# Patient Record
Sex: Male | Born: 1960 | Race: White | Hispanic: No
Health system: Midwestern US, Academic
[De-identification: ages and names within clinical notes are randomized; demographics above are authoritative.]

---

## 2017-04-14 ENCOUNTER — Encounter: Admit: 2017-04-14 | Discharge: 2017-04-14 | Payer: MEDICAID

## 2017-05-24 LAB — BASIC METABOLIC PANEL
Lab: 101 (ref 98–107)
Lab: 102 (ref 70–105)
Lab: 13 (ref 10–20)
Lab: 139 (ref 136–145)
Lab: 15 — ABNORMAL HIGH (ref 0–14)
Lab: 27 (ref 22–29)
Lab: 4.4 (ref 3.5–5.1)
Lab: 46
Lab: 54
Lab: 9 (ref 8.4–10.2)

## 2017-05-25 ENCOUNTER — Encounter: Admit: 2017-05-25 | Discharge: 2017-05-25 | Payer: MEDICAID

## 2017-05-25 ENCOUNTER — Ambulatory Visit: Admit: 2017-05-25 | Discharge: 2017-05-25 | Payer: MEDICAID

## 2017-05-25 ENCOUNTER — Ambulatory Visit: Admit: 2017-05-25 | Discharge: 2017-05-26 | Payer: Medicaid Other

## 2017-05-25 ENCOUNTER — Encounter: Admit: 2017-05-25 | Discharge: 2017-05-25 | Payer: Medicaid Other

## 2017-05-25 DIAGNOSIS — E1143 Type 2 diabetes mellitus with diabetic autonomic (poly)neuropathy: ICD-10-CM

## 2017-05-25 DIAGNOSIS — I509 Heart failure, unspecified: ICD-10-CM

## 2017-05-25 DIAGNOSIS — I420 Dilated cardiomyopathy: Principal | ICD-10-CM

## 2017-05-25 DIAGNOSIS — Z794 Long term (current) use of insulin: ICD-10-CM

## 2017-05-25 DIAGNOSIS — I5022 Chronic systolic (congestive) heart failure: ICD-10-CM

## 2017-05-25 DIAGNOSIS — I429 Cardiomyopathy, unspecified: Principal | ICD-10-CM

## 2017-05-25 DIAGNOSIS — I5023 Acute on chronic systolic (congestive) heart failure: Principal | ICD-10-CM

## 2017-05-25 DIAGNOSIS — E119 Type 2 diabetes mellitus without complications: ICD-10-CM

## 2017-05-25 DIAGNOSIS — I739 Peripheral vascular disease, unspecified: ICD-10-CM

## 2017-05-25 LAB — URINALYSIS DIPSTICK REFLEX TO CULTURE
Lab: 1 (ref 1.003–1.035)
Lab: 6 (ref 5.0–8.0)
Lab: NEGATIVE

## 2017-05-25 LAB — PROTEIN/CR RATIO,UR RAN
Lab: 0.2
Lab: 192 mg/dL
Lab: 30 mg/dL — AB

## 2017-05-25 LAB — URINALYSIS MICROSCOPIC REFLEX TO CULTURE

## 2017-05-25 MED ORDER — FUROSEMIDE IV DRIP SYR (MAX CONC)
40-80 mg/h | INTRAVENOUS | 0 refills | Status: CN
Start: 2017-05-25 — End: ?

## 2017-05-25 MED ORDER — CHLOROTHIAZIDE SODIUM 500 MG IV SOLR
250-500 mg | INTRAVENOUS | 0 refills | Status: CN | PRN
Start: 2017-05-25 — End: ?

## 2017-05-25 MED ORDER — PERFLUTREN LIPID MICROSPHERES 1.1 MG/ML IV SUSP
1-20 mL | Freq: Once | INTRAVENOUS | 0 refills | Status: CP
Start: 2017-05-25 — End: ?
  Administered 2017-05-25: 16:00:00 1.5 mL via INTRAVENOUS

## 2017-05-25 MED ORDER — MAGNESIUM OXIDE 400 MG PO TAB
400 mg | Freq: Once | ORAL | 0 refills | Status: CN
Start: 2017-05-25 — End: ?

## 2017-05-25 MED ORDER — FUROSEMIDE 80 MG PO TAB
ORAL_TABLET | ORAL | 5 refills | 90.00000 days | Status: AC
Start: 2017-05-25 — End: 2017-06-04

## 2017-05-25 MED ORDER — FUROSEMIDE 10 MG/ML IJ SOLN
40 mg | INTRAVENOUS | 0 refills | Status: CN
Start: 2017-05-25 — End: ?

## 2017-05-25 MED ORDER — POTASSIUM CHLORIDE 10 MEQ PO TBTQ
10-60 meq | ORAL | 0 refills | Status: CN | PRN
Start: 2017-05-25 — End: ?

## 2017-05-25 NOTE — Progress Notes
Date of Service: 05/25/2017    Bosie Summit is a 56 y.o. male.       HPI     It was my pleasure to see Mr. Sciarrino, a 56 year old male, in the Heart Failure Clinic.  I saw him first in early September, 2016.  He had just moved from [place] to the state of [place].  Prior to his relocation, he had his primary prevention ICD for an EF of 25% in December 2015 and subsequently he had his CardioMEMS somewhere in June 2016 prior to relocation in [place].  He had history of nonischemic cardiomyopathy as coronary angiogram in 2016 was without any obstructive coronary artery disease.  All these tests were done in [place] in [place].     ???  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  His diabetes is being managed by his primary care, he reported that his hemoglobin A1c is reasonably under control.  ???  He also had CardioMEMS placed, and his pillow is not working.  We have advised him multiple times to get in touch with [employer] and [employer] to get his new pillow, however, he had issues with payments and he was not able to pay as he did not have any insurance.  ???  Mr. Rhunette Croft returns approximately 18 months later in the heart failure clinic, his care has been [facility], in May 2017 he had foot surgery and amputation of right toes, I do not have all the details.    He was also known to have prolonged QRS and he was upgraded to CRT D and [facility] in May.  He continued to have low ejection fraction with LV size of 6.8 cm on his most recent echocardiogram on August 2018.  Previously his LV size was 7.6 cm with EF of 20%    Since his of gradation of CRT-D, he is not significantly better however he did not lose weight and in fact has gained from baseline weight of 290-310 pounds today.  He reports NYHA class 2-3 symptoms reports able to walk 3 blocks  He reports that he uses 3 or 4 pillows suggestive of some orthopnea.  His pillow is not working and therefore we do not have any CardioMEMS readings on him.          Vitals:    05/25/17 1148   BP: 148/82   Pulse: 76   SpO2: 93%   Weight: (!) 139.7 kg (308 lb)   Height: 1.829 m (6')     Body mass index is 41.77 kg/m???.     Past Medical History  Patient Active Problem List    Diagnosis Date Noted   ??? Dilated cardiomyopathy (HCC) 12/04/2015   ??? Community acquired pneumonia 08/06/2015   ??? Achalasia of esophagus 06/21/2015   ??? Cardiac defibrillator in place 06/21/2015     Placed in January, 2016    [employer] DDD-ICD     ??? 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 Dyslipidemia 06/18/2015   ??? Hypertension 06/18/2015   ??? Diabetic neuropathy (HCC) 06/18/2015   ??? Syncope 06/18/2015   ??? Chronic systolic heart failure (HCC) 06/18/2015     cardiomems placement June 2016  Basline PA 55/33    ECHO 04/12/2014: LVEF 50-55%  Mildly dilated LV, normal wall  motion, grade 2 diastolic dysfunction, L atrium is mildly dilated, mild aortic regurgitation, mildly dilated aortic root     Cardiac Cath:  04/18/2013: L Heart Cath/L Ventriculography/Selective coronary angiography  -normal coronary arteries, LV is dilated and has reduced EF of 41%    03/05/2015: R Heart Cath, Pulmonary angiography, placement of cardioMEM   -CI Fick 2.1, CO Fick 5., PA Sat 60.5, RV 60/16, PA 67/34, PCW 17             Review of Systems   Constitution: Negative.   HENT: Negative.    Eyes: Negative.    Cardiovascular: Negative.    Respiratory: Negative.    Endocrine: Negative.    Hematologic/Lymphatic: Negative.    Skin: Negative.    Musculoskeletal: Negative.    Gastrointestinal: Negative.    Genitourinary: Negative.    Neurological: Negative.    Psychiatric/Behavioral: Negative.    Allergic/Immunologic: Negative. All other systems reviewed and are negative.      Physical Exam  General Appearance: no acute distress   Neck Veins: Neck veins DISTENDED CVP 12 mmHg   Extremities: Normal bilateral pedal pulses.2+ Pedal Edema .  Carotid Arteries: normal carotid upstroke bilaterally, no bruits   Cardiac Rhythm: regular rhythm and normal rate   Cardiac Auscultation: Normal S1 & S2, no S3 or S4, no rub, 2/5 HSM at mitral area    Chest Inspection: chest is normal in appearance   Auscultation/Percussion: lungs clear to auscultation, no rales, rhonchi    Skin: warm, moist, no ulcers   HENT: Moist oral mucosa.   Eyes; Pupils are round and reactive. No icterus  Abdominal Exam: soft, non-tender, no masses, bowel sounds normal no organomegaly   Neurologic Exam: neurological assessment grossly intact      Cardiovascular Studies      Problems Addressed Today  No diagnosis found.    Assessment and Plan     Heart failure with reduced ejection fraction with EF of 25-30% with LV size of 6.8 cm.  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  His LV size has reduced from 7.6 cm to 6.8 cm however continued low ejection fraction of 25-30%, aortic regurgitation is moderate.  He is quite volume overloaded therefore we are proceeding with infusion therapy to goal at least 20 pounds fluid loss and try to achieve 290 pounds.  He had all his labs drawn yesterday by his primary care, requested him to send the fax of results from yesterday to [place].  It is important to check his hemoglobin A1c if it is not done so.    We reviewed his device which did not show any evidence of VT or SVT.    He will have infusion clinic visits on Thursday and Friday and based on a response further visits will be scheduled.  He already has CardioMEMS, we will arrange to get him his pillow to measure his CardioMEMS readings to establish his dry weight.    He had his repeat angiogram as well as Peripheral arterial  disease workup at [facility] last year we will request records.    In future we will also address bariatric surgery option once his heart failure symptoms are under control.    He wanted to have a spinal injection, he can come off a apixaban if needed for 5-7 days in order to control his low back pain, he does not need bridging as risk of his spinal complications with anticoagulation exceeds the benefits of anticoagulation which is prevention of stroke.      Total time 50 minutes. Estimated counseling time 35 minutes including risks/benefits/alternatives discussion related to plan as outlined and answering all questions related to the care plan while educating on the importance of adherence to recommended therapies, outpatient follow-up, and also addressing prognosis specific to the patient/family concerns.    Renita Papa MD, MSc  [facility]   [facility]  Pager: [phone]                  Current Medications (including today's revisions)  ??? albuterol-ipratropium (DUO-NEB, DUO-VENT) 0.5 mg-3 mg(2.5 mg base)/3 mL nebulizer solution Inhale 3 mL solution by nebulizer as directed four times daily.   ??? amiodarone (CORDARONE) 200 mg tablet Take 200 mg by mouth twice daily. Take with food.   ??? apixaban (ELIQUIS) 5 mg tablet Take 5 mg by mouth twice daily.   ??? aspirin EC 81 mg tablet Take 81 mg by mouth daily. Take with food.   ??? Blood Pressure Monitor (BLOOD PRESSURE KIT) kit Use as directed ??? carvedilol (COREG) 3.125 mg tablet Take 3.125 mg by mouth twice daily with meals. Take with food.   ??? cyclobenzaprine (FLEXERIL) 10 mg tablet Take 10 mg by mouth daily as needed.   ??? HYDROcodone/acetaminophen(+) (NORCO) 10/325 mg tablet Take 1 Tab by mouth every 8 hours as needed for Pain   ??? insulin glargine (TOUJEO SOLOSTAR) 300 unit/mL (1.5 mL) injectable Inject 40 Units under the skin at bedtime daily.   ??? lisinopril (PRINIVIL, ZESTRIL) 2.5 mg tablet Take 2.5 mg by mouth daily.   ??? nitroglycerin (NITROSTAT) 0.4 mg tablet Place 0.4 mg under tongue every 5 minutes as needed for Chest Pain. Max of 3 tablets, call 911.   ??? NOVOLOG FLEXPEN U-100 INSULIN 100 unit/mL injection PEN Inject 15 Units under the skin three times daily with meals.   ??? omeprazole DR(+) (PRILOSEC) 20 mg capsule Take 20 mg by mouth daily before breakfast.   ??? potassium chloride SR (K-DUR) 10 mEq tablet Take 10 mEq by mouth daily. Take with a meal and a full glass of water.   ??? pregabalin (LYRICA) 150 mg capsule Take 150 mg by mouth three times daily.   ??? simvastatin (ZOCOR) 20 mg tablet Take 20 mg by mouth at bedtime daily.   ??? spironolactone (ALDACTONE) 50 mg tablet Take 1 Tab by mouth daily. Take with food.  Indications: CHRONIC HEART FAILURE (Patient taking differently: Take 25 mg by mouth daily. Take with food.)

## 2017-05-25 NOTE — Progress Notes
The AVRU with Marcial Pacas, [phone], confirmed benefits and eligibility:  Patient is enrolled and the plan is active.  It will pay 100% of allowable charges through [place] [employer].  No pre-certification is required for CHF Infusions using Injectafer, Iron Sucrose, Lasix or Bumex 96365 [id number] F3545 J1756 I1940 S0171.  Reference 05/25/2017 13:25

## 2017-05-25 NOTE — Progress Notes
Peripheral IV Insertion Note:  Patient Side: right  Line Orientation:Upper Arm  IV Catheter Size: 24G  Number of Attempts:1.  IV capped and flushed with Normal Saline.  IV site without redness, swelling, or pain.  New dressing placed.

## 2017-05-27 ENCOUNTER — Encounter: Admit: 2017-05-27 | Discharge: 2017-05-27 | Payer: MEDICAID

## 2017-05-27 ENCOUNTER — Ambulatory Visit: Admit: 2017-05-27 | Discharge: 2017-05-28 | Payer: MEDICAID

## 2017-05-27 DIAGNOSIS — I5023 Acute on chronic systolic (congestive) heart failure: Principal | ICD-10-CM

## 2017-05-27 DIAGNOSIS — I480 Paroxysmal atrial fibrillation: ICD-10-CM

## 2017-05-27 DIAGNOSIS — I509 Heart failure, unspecified: ICD-10-CM

## 2017-05-27 DIAGNOSIS — E119 Type 2 diabetes mellitus without complications: ICD-10-CM

## 2017-05-27 DIAGNOSIS — I739 Peripheral vascular disease, unspecified: ICD-10-CM

## 2017-05-27 DIAGNOSIS — I10 Essential (primary) hypertension: ICD-10-CM

## 2017-05-27 DIAGNOSIS — I429 Cardiomyopathy, unspecified: Principal | ICD-10-CM

## 2017-05-27 LAB — POC BASIC METABOLIC PANEL (BMP)
Lab: 1.5 mg/dL — ABNORMAL HIGH (ref 0.4–1.24)
Lab: 136 MMOL/L — ABNORMAL LOW (ref 137–147)
Lab: 191 mg/dL — ABNORMAL HIGH (ref 70–100)
Lab: 27 mg/dL — ABNORMAL HIGH (ref 7–25)
Lab: 30 MMOL/L (ref 21–30)
Lab: 4 MMOL/L (ref 3.5–5.1)
Lab: 95 MMOL/L — ABNORMAL LOW (ref 98–110)
Lab: 16 — ABNORMAL HIGH (ref 3–12)

## 2017-05-27 LAB — BNP POC ER: Lab: 430 pg/mL — ABNORMAL HIGH (ref 0–100)

## 2017-05-27 MED ORDER — POTASSIUM CHLORIDE 10 MEQ PO TBTQ
10-60 meq | ORAL | 0 refills | Status: DC | PRN
Start: 2017-05-27 — End: 2017-05-27
  Administered 2017-05-27: 16:00:00 40 meq via ORAL

## 2017-05-27 MED ORDER — MAGNESIUM OXIDE 400 MG PO TAB
400 mg | Freq: Once | ORAL | 0 refills | Status: CN
Start: 2017-05-27 — End: ?

## 2017-05-27 MED ORDER — FUROSEMIDE 10 MG/ML IJ SOLN
40 mg | INTRAVENOUS | 0 refills | Status: CP
Start: 2017-05-27 — End: ?
  Administered 2017-05-27 (×3): 40 mg via INTRAVENOUS

## 2017-05-27 MED ORDER — MAGNESIUM OXIDE 400 MG PO TAB
400 mg | Freq: Once | ORAL | 0 refills | Status: CP
Start: 2017-05-27 — End: ?
  Administered 2017-05-27: 16:00:00 400 mg via ORAL

## 2017-05-27 MED ORDER — FUROSEMIDE 10 MG/ML IJ SOLN
40 mg | INTRAVENOUS | 0 refills | Status: CN
Start: 2017-05-27 — End: ?

## 2017-05-27 MED ORDER — FUROSEMIDE IV DRIP SYR (MAX CONC)
40-80 mg/h | INTRAVENOUS | 0 refills | Status: CN
Start: 2017-05-27 — End: ?

## 2017-05-27 MED ORDER — POTASSIUM CHLORIDE 10 MEQ PO TBTQ
10-60 meq | ORAL | 0 refills | Status: CN | PRN
Start: 2017-05-27 — End: ?

## 2017-05-27 MED ORDER — FUROSEMIDE IV DRIP SYR (MAX CONC)
40-80 mg/h | INTRAVENOUS | 0 refills | Status: DC
Start: 2017-05-27 — End: 2017-05-27
  Administered 2017-05-27: 16:00:00 40 mg/h via INTRAVENOUS

## 2017-05-27 MED ORDER — CHLOROTHIAZIDE SODIUM 500 MG IV SOLR
250-500 mg | INTRAVENOUS | 0 refills | Status: CN | PRN
Start: 2017-05-27 — End: ?

## 2017-05-27 NOTE — Progress Notes
Summary for Heart Failure Infusion Clinic   Today's Date: 05/27/2017               Todays Goal: 2 L      Today you diuresed a total of 1575 ml     Total dose of IV Push: 120mg  of Lasix     400 mg of Magnesium   40 mEq of Potassium      IV Infusion dose was: 40mg /hr x 3 hrs     Pre-infusion weight was: 307 lbs     Post-Infusion weight was: 302.4 lbs     Total Net Output:-1463ml       Post Infusion Vitals for today:                Blood Pressure:99/60              Heart Rate:69              Oxygen Saturation:95%

## 2017-05-27 NOTE — Progress Notes
Cardiology Infusion Progress Note    Today's Date:  05/27/2017  Name: Larry Pena     MRN: [id number]     HPI:     Larry Pena is a 56 y.o. male.   Patient presents today for first  infusion for symptomatic treatment of decompensated heart failure symptoms. Patient has never been in the Infusion clinic before.     Pt was referred to HF Infusion Clinic by Dr B.Chales Abrahams after being seen in HF clinic on 05/25/17 and was found to have ~20 lbs excess fluid on board.    05/25/17 (Dr Chales Abrahams clinic visit) Initial weight: 308 lbs    Today:   Wt Readings from Last 1 Encounters:   05/27/17 (!) 139.3 kg (307 lb)       Today he reports productive cough with thick green sputum, peripheral edema and weight change.     He denies shortness of breath, dyspnea on exertion, paroxysmal nocturnal dyspnea, orthopnea, lightheadedness, palpitations, chest pain, abdominal fullness and muscle cramping.  He reports taking medication as instructed.       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 BNP today is 430. He admits to having difficulty following fluid restriction. Diuretic is furosemide 80 mg in AM and 40 mg in PM with Kdur 20 mEq daily. Will plan to diurese today per protocol.   2. Non-ischemic cardiomyopathy, on suboptimal neurohormonal antagonist (NHA). Currently taking carvedilol 3.125 mg bid, lisinopril 2.5 mg daily, spironolactone 25 mg daily. He recently had Echo completed on 8/21 however results are not yet available. Prior echo in 11/2015 showed EF 15-20% with grade I diastolic dysfunction.   3. 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 Creatinine today is 1.5.Will monitor renal function closely; check BMP daily before starting diuresis.   4. Coronary Artery Disease surveillance. LHC in 2016 was negative for obstructive CAD. No current symptoms of ischemia.Will continue on his Asa 81 mg daily and simvastatin 20 mg qhs.   5. Hypertension. Blood pressure: 124/65. Currently taking carvedilol, lisinopril, and spironolactone as described above.   6. CRT-D. Recently upgraded in 02/2017. Per last check on 03/03/17 showed 99.6% biventricular pacing with normal device function. Will continue to optimize device function.   7. Atrial arrhythmias. Continues chronic anticoagulation with eliquis 5 mg bid.   8. DM 2. Currently taking Toujeo 40 units subq QHS, Novolog 15 units subq tid with meals. Last HbgA1c 10.0 in 11/2015; he reports he has been getting most of his care at [facility] in [place], [place]. No recent notes in Care Everywhere regarding his diabetes.       Plans    1. Furosemide IV infusion therapy with 40 mg IV push and  40 mg/hr continuous infusion for 3 hours. Additional bolus times 2.   2. BMP, BNP  3. Goal:  2 liters net output.   4. Goal weight: ~290 lbs      5. Potassium 40 mEq  PO   6. Magnesium oxide 400mg  PO   7. Dietary counseling  8. Pharmacy counseling      Discharge Recommendations:    1. Resume home medications, including routine diuretic schedule.  2. Daily weight  monitoring.  Bring weight log to follow-up appointment.  3. 2000mg  sodium dietary restriction.  Bring dietary log to follow-up appointment.  4. Return to HF Infusion Clinic on 05/28/17     I had a long discussion with [name] today about his concerns, condition, and treatment options. All questions from [name] were answered in detail to the extent that [name] understands and agrees with the medical plan. Total visit of 50 minutes with [name] of which 45 minutes were dedicated to advanced HF counseling and care coordination.     Objective:      Visit Diagnosis:   No diagnosis found.      Medications Current Outpatient Prescriptions on File Prior to Visit   Medication Sig Dispense Refill   ??? albuterol-ipratropium (DUO-NEB, DUO-VENT) 0.5 mg-3 mg(2.5 mg base)/3 mL nebulizer solution Inhale 3 mL solution by nebulizer as directed four times daily.     ??? amiodarone (CORDARONE) 200 mg tablet Take 200 mg by mouth twice daily. Take with food.     ??? apixaban (ELIQUIS) 5 mg tablet Take 5 mg by mouth twice daily.     ??? aspirin EC 81 mg tablet Take 81 mg by mouth daily. Take with food.     ??? Blood Pressure Monitor (BLOOD PRESSURE KIT) kit Use as directed 1 Kit 0   ??? carvedilol (COREG) 3.125 mg tablet Take 3.125 mg by mouth twice daily with meals. Take with food.     ??? cyclobenzaprine (FLEXERIL) 10 mg tablet Take 10 mg by mouth daily as needed.     ??? furosemide (LASIX) 80 mg tablet Please take Lasix 80 mg in the am, 40 mg (1/2 tablet) in the afternoon 45 tablet 5   ??? HYDROcodone/acetaminophen(+) (NORCO) 10/325 mg tablet Take 1 Tab by mouth every 8 hours as needed for Pain     ??? insulin glargine (TOUJEO SOLOSTAR) 300 unit/mL (1.5 mL) injectable Inject 40 Units under the skin at bedtime daily.     ??? lisinopril (PRINIVIL, ZESTRIL) 2.5 mg tablet Take 2.5 mg by mouth daily.     ??? nitroglycerin (NITROSTAT) 0.4 mg tablet Place 0.4 mg under tongue every 5 minutes as needed for Chest Pain. Max of 3 tablets, call 911.     ??? NOVOLOG FLEXPEN U-100 INSULIN 100 unit/mL injection PEN Inject 15 Units under the skin three times daily with meals.  7   ??? omeprazole DR(+) (PRILOSEC) 20 mg capsule Take 20 mg by mouth daily before breakfast.     ??? potassium chloride SR (K-DUR) 10 mEq tablet Take 10 mEq by mouth daily. Take with a meal and a full glass of water.     ??? pregabalin (LYRICA) 150 mg capsule Take 150 mg by mouth three times daily.     ??? simvastatin (ZOCOR) 20 mg tablet Take 20 mg by mouth at bedtime daily.     ??? spironolactone (ALDACTONE) 50 mg tablet Take 1 Tab by mouth daily. Take with food.  Indications: CHRONIC HEART FAILURE (Patient taking differently: Take 25 mg by mouth daily. Take with food.) 90 Tab 3     Current Facility-Administered Medications on File Prior to Visit   Medication Dose Route Frequency Provider Last Rate Last Dose   ??? furosemide (LASIX) 500 mg in empty IV bag 50 mL IV drip syr (max conc)  40-80 mg/hr Intravenous Continuous Ivory Broad, APRN 4 mL/hr at 05/27/17 1103 40 mg/hr at 05/27/17 1103   ??? furosemide (LASIX) injection 40  mg  40 mg Intravenous Q1H X Loleta Books, APRN   40 mg at 05/27/17 1104   ??? potassium chloride SR (K-DUR) tablet 10-60 mEq  10-60 mEq Oral PRN Ivory Broad, APRN   40 mEq at 05/27/17 1108           Vitals       Vitals:    05/27/17 1053   BP: 124/65   Pulse: 71   Resp: 20   SpO2: 100%       Wt Readings from Last 20 Encounters:   05/27/17 (!) 139.3 kg (307 lb)   05/27/17 (!) 139.3 kg (307 lb)   05/25/17 (!) 139.7 kg (308 lb)   05/25/17 (!) 140.6 kg (310 lb)   03/03/17 (!) 137.4 kg (303 lb)   08/06/16 (!) 136.5 kg (301 lb)   12/04/15 120.7 kg (266 lb 1.6 oz)   11/20/15 122.7 kg (270 lb 6.4 oz)   11/20/15 122.7 kg (270 lb 6.4 oz)   11/08/15 122.6 kg (270 lb 3.2 oz)   08/06/15 125.5 kg (276 lb 9.6 oz)   07/08/15 122.5 kg (270 lb)   06/21/15 125.6 kg (277 lb)            Last Echo Result:    Echo Results  (Last 3 results in the past 3 years)    Echo EF LVIDD LA Size IVS LVPW Rest PAP    (11/20/15)  20 (11/20/15)  7.6 (11/20/15)  4.4 (11/20/15)  0.9 (11/20/15)  0.9 (11/20/15)  24                  Physical Exam:    General Appearance: no acute distress  Skin: warm and dry  Digits and Nails: normal color, smooth symmetric nails and digits  Eyes: conjunctivae and lids normal  Neck Veins: JVP 8-9 cm, slight HJR  Chest Inspection: L chest ICD pocket healed  Auscultation: breathing comfortably, lungs clear to auscultation, no rales or rhonchi, no wheezing  Cardiac Auscultation: Regular rhythm, S1, S2, no S3 or S4, no murmur Radial Arteries: normal symmetric radial pulses  Pedal Pulses: normal symmetric pedal pulses  Lower Extremity Edema: no lower extremity edema  Abdominal Exam: soft, round, obese, non-tender, bowel sounds normal  Gait & Station: gait stable  Muscle Strength: normal strength and tone  Orientation: clear historian, good insight           Laboratory Review:     Most Recent Point of Care Results:     Sodium-POC   Date Value Ref Range Status   11/08/2015 137 137 - 147 MMOL/L Final     Potassium-POC   Date Value Ref Range Status   11/08/2015 4.6 3.5 - 5.1 MMOL/L Final     Chloride, POC   Date Value Ref Range Status   11/08/2015 97 (L) 98 - 110 MMOL/L Final     CO2, POC   Date Value Ref Range Status   11/08/2015 23 21 - 30 MMOL/L Final     Anion Gap, POC   Date Value Ref Range Status   11/08/2015 22 (H) 3 - 12 Final     Bun, POC   Date Value Ref Range Status   11/08/2015 32 (H) 7 - 25 MG/DL Final     Ionized Calcium-POC   Date Value Ref Range Status   11/08/2015 1.04 1.0 - 1.3 MMOL/L Final     Glucose, POC   Date Value Ref Range Status   11/08/2015 287 (H) 70 -  100 MG/DL Final     BNP POC   Date Value Ref Range Status   07/08/2015 302.0 (H) 0 - 100 PG/ML Final           Satira Anis, [facility]  Pager: [phone]

## 2017-05-28 ENCOUNTER — Ambulatory Visit: Admit: 2017-05-28 | Discharge: 2017-05-28 | Payer: MEDICAID

## 2017-05-28 ENCOUNTER — Encounter: Admit: 2017-05-28 | Discharge: 2017-05-28 | Payer: MEDICAID

## 2017-05-28 ENCOUNTER — Ambulatory Visit: Admit: 2017-05-27 | Discharge: 2017-05-27 | Payer: MEDICAID

## 2017-05-28 DIAGNOSIS — I509 Heart failure, unspecified: ICD-10-CM

## 2017-05-28 DIAGNOSIS — I480 Paroxysmal atrial fibrillation: ICD-10-CM

## 2017-05-28 DIAGNOSIS — I5022 Chronic systolic (congestive) heart failure: Principal | ICD-10-CM

## 2017-05-28 DIAGNOSIS — I5023 Acute on chronic systolic (congestive) heart failure: Principal | ICD-10-CM

## 2017-05-28 DIAGNOSIS — E119 Type 2 diabetes mellitus without complications: ICD-10-CM

## 2017-05-28 DIAGNOSIS — I110 Hypertensive heart disease with heart failure: ICD-10-CM

## 2017-05-28 DIAGNOSIS — I252 Old myocardial infarction: ICD-10-CM

## 2017-05-28 DIAGNOSIS — N183 Chronic kidney disease, stage 3 (moderate): ICD-10-CM

## 2017-05-28 DIAGNOSIS — I429 Cardiomyopathy, unspecified: Principal | ICD-10-CM

## 2017-05-28 DIAGNOSIS — I739 Peripheral vascular disease, unspecified: ICD-10-CM

## 2017-05-28 MED ORDER — MAGNESIUM OXIDE 400 MG PO TAB
400 mg | Freq: Once | ORAL | 0 refills | Status: DC
Start: 2017-05-28 — End: 2017-05-28

## 2017-05-28 MED ORDER — POTASSIUM CHLORIDE 10 MEQ PO TBTQ
10-60 meq | ORAL | 0 refills | Status: DC | PRN
Start: 2017-05-28 — End: 2017-05-28

## 2017-05-28 MED ORDER — FUROSEMIDE IV DRIP SYR (MAX CONC)
40-80 mg/h | INTRAVENOUS | 0 refills | Status: DC
Start: 2017-05-28 — End: 2017-05-28

## 2017-05-28 MED ORDER — FUROSEMIDE 10 MG/ML IJ SOLN
40 mg | INTRAVENOUS | 0 refills | Status: DC
Start: 2017-05-28 — End: 2017-05-28

## 2017-05-28 MED ORDER — POTASSIUM CHLORIDE 10 MEQ PO TBTQ
10-60 meq | ORAL | 0 refills | Status: CN | PRN
Start: 2017-05-28 — End: ?

## 2017-05-28 MED ORDER — FUROSEMIDE 10 MG/ML IJ SOLN
40 mg | INTRAVENOUS | 0 refills | Status: CN
Start: 2017-05-28 — End: ?

## 2017-05-28 MED ORDER — MAGNESIUM OXIDE 400 MG PO TAB
400 mg | Freq: Once | ORAL | 0 refills | Status: CN
Start: 2017-05-28 — End: ?

## 2017-05-28 MED ORDER — CHLOROTHIAZIDE SODIUM 500 MG IV SOLR
250-500 mg | INTRAVENOUS | 0 refills | Status: CN | PRN
Start: 2017-05-28 — End: ?

## 2017-05-28 MED ORDER — FUROSEMIDE IV DRIP SYR (MAX CONC)
40-80 mg/h | INTRAVENOUS | 0 refills | Status: CN
Start: 2017-05-28 — End: ?

## 2017-05-28 NOTE — Patient Education
PHARMACIST NOTE:    Pharmacy was consulted on patient for the HF Infusion Clinic.  An medication history has been performed for Larry Pena by a pharmacy team member. Please note the following:    Issues for follow-up:  1.  Needing a letter of approval from cardiologist on whether he can stop his Eliquis for a spinal injection.  Satira Anis, APRN is aware and is having HF clinic fax letter.   2. Update medication list and add indications to medication list -- patient has never seen a pharmacist at [facility].  Preformed detailed medication reconciliation with patient/prescription pill bottles and updated medication list.    3.  Patient states that he has been having hypoglcemia 2-3 times a week associated with this mid-morning/noon blood sugar readings.  Of note: patient has his PCP follow his diabetes.  However, Dr. Chales Abrahams made recent changes (decreased Toujeo) this week.  Patient is interested in establishing endocrinology care at [facility].    4. Of note: holding Lisinopril/Spironolactone due to renal function.  Medications removed from patient's medication list.  Patient is NOT getting furosemide infusion today in HF Infusion Clinic.      Source of information used for interview: patient ; prescription pill bottles    Pharmacy:  ??? Patient fills their prescriptions at [facility]. Drug [address], [place])  ??? Patient would like their discharge prescriptions filled at [facility]. Drug [address], [place])    Allergies: Patient has no known allergies.    Medications:  Your Current Medications:       Instructions    amiodarone (CORDARONE) 200 mg tablet Take 200 mg by mouth twice daily. Take with food.     apixaban (ELIQUIS) 5 mg tablet Take 5 mg by mouth twice daily.    aspirin EC 81 mg tablet Take 81 mg by mouth daily. Take with food.     Blood Pressure Monitor (BLOOD PRESSURE KIT) kit Use as directed    buPROPion SR(+) (WELLBUTRIN, ZYBAN) 200 mg tablet Take 200 mg by mouth daily.    carvedilol (COREG) 3.125 mg tablet Take 3.125 mg by mouth twice daily with meals. Take with food.     doxycycline (VIBRAMYCIN) 100 mg tablet Take 100 mg by mouth twice daily. Prescribed to take for 7 days    furosemide (LASIX) 80 mg tablet Please take Lasix 80 mg in the am, 40 mg (1/2 tablet) in the afternoon    **Of note: is using up current 40mg  tablet strength and will switch to 80mg  tablets when finished.     insulin glargine (TOUJEO SOLOSTAR) 300 unit/mL (1.5 mL) injectable Inject 35 Units under the skin at bedtime daily.    nitroglycerin (NITROSTAT) 0.4 mg tablet Place 0.4 mg under tongue every 5 minutes as needed for Chest Pain. Max of 3 tablets, call 911.     NOVOLOG FLEXPEN U-100 INSULIN 100 unit/mL injection PEN Inject 15 Units under the skin three times daily with meals.    omeprazole DR(+) (PRILOSEC) 40 mg capsule Take 40 mg by mouth daily before breakfast.    potassium chloride SR (K-DUR) 10 mEq tablet Take 10 mEq by mouth twice daily. Take with a meal and a full glass of water.     **OF note: also has new Rx for tablet strength ---> using up tablets first.      pregabalin (LYRICA) 150 mg capsule Take 150 mg by mouth three times daily.    simvastatin (ZOCOR) 20 mg tablet Take 20 mg by mouth at bedtime daily.  Patient reports wanting to stop medication: Bupropion SR --- patient does not feel like he needs it anymore and states that his PCP stated that he could stop.  Patient does not use a pill box at home.  Patient uses old prescription bottle for his AM and PM medication administrations.  He was NOT interested in using a bulky pill box organizer.  Patient denies missing doses of medication.      Heart Failure Management:  Side effects to patient-specific heart failure medications have been assessed at this visit.  ??? ACE/ARB: denies cough  ??? Diuretic: denies muscle cramps, nocturia  ??? Aldosterone antagonist: denies gynecomastia  ??? Anticoagulant/andtiplatelet: denies unusual bleeding/bruising  ??? Statin: denies muscle cramps ??? Amiodarone: denies visual disturbances (halo vision, bilateral eye pain/decreased vision), blue skin discoloration    Medication Adherence:  Patient reports to adherence and denies missed doses.  Patient     Patient received education/counseling from the pharmacist on the following subjects today:  Discussed role of ACE inhibitors  Discussed role of beta-blockers  Discussed role of diuretics  Discussed role of aldosterone antagonists  Patient received information on congestive heart failure: obtaining daily weights, fluid restriction, when to contact HF clinic.      Frutoso Schatz, PHARMD

## 2017-05-28 NOTE — Progress Notes
Cardiology Infusion Progress Note    Today's Date:  05/28/2017  Name: Larry Pena     MRN: [id number]     HPI:     Larry Pena is a 56 y.o. male.   Patient presents today for second infusion for symptomatic treatment of decompensated heart failure symptoms. Patient has never been in the Infusion clinic before.     Pt was referred to HF Infusion Clinic by Dr B.Chales Abrahams after being seen in HF clinic on 05/25/17 and was found to have ~20 lbs excess fluid on board.    05/25/17 (Dr Chales Abrahams clinic visit) Initial weight: 308 lbs    Today:   Wt Readings from Last 1 Encounters:   05/27/17 (!) 139.3 kg (307 lb)       Today he reports fatigue, peripheral edema and weight change.     He denies shortness of breath, dyspnea on exertion, paroxysmal nocturnal dyspnea, orthopnea, lightheadedness, palpitations, chest pain, abdominal fullness and muscle cramping.  He reports taking medication as instructed.       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 BNP today is 430. He admits to having difficulty following fluid restriction. Diuretic is furosemide 80 mg in AM and 40 mg in PM with Kdur 20 mEq daily. Will not diurese today. He is s/p CardioMems PA sensor device however has not been using it to transmit PA readings. He reports that he has located his Mems pillow and is working on getting it sent back into the company to obtain a new pillow.   2. Non-ischemic cardiomyopathy, on suboptimal neurohormonal antagonist (NHA). Currently taking carvedilol 3.125 mg bid, lisinopril 2.5 mg daily, spironolactone 25 mg daily. Instructed to hold his lisinopril and spironolactone through the weekend (see below). He recently had Echo completed on 8/21 however results are not yet available. Prior echo in 11/2015 showed EF 15-20% with grade I diastolic dysfunction.   3. 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 Creatinine today is 1.8 .Will not infuse today due to elevated renal function. Instructed to hold his spironolactone and lisinopril through the weekend to allow renal function to improve.   4. Coronary Artery Disease surveillance. LHC in 2016 was negative for obstructive CAD. No current symptoms of ischemia.Will continue on his Asa 81 mg daily and simvastatin 20 mg qhs.   5. Hypertension. Blood pressure: 130/62. Currently taking carvedilol, lisinopril, and spironolactone as described above.   6. CRT-D. Recently upgraded in 02/2017. Per last check on 03/03/17 showed 99.6% biventricular pacing with normal device function. Will continue to optimize device function.   7. Atrial arrhythmias. Continues chronic anticoagulation with eliquis 5 mg bid.   8. DM 2. Currently taking Toujeo 40 units subq QHS, Novolog 15 units subq tid with meals. Last HbgA1c 10.0 in 11/2015; he reports he has been getting most of his care at [facility] in [place], [place]. No recent notes in Care Everywhere regarding his diabetes.       Plans    1. No infusion today  2. BMP  3. Goal:  2 liters net output.   4. Goal weight: ~290-295 lbs      5. No potassium today  6. No magnesium today   7. Dietary counseling  8. Pharmacy counseling      Discharge Recommendations:    1. Resume home medications, including routine diuretic schedule.  2. Daily weight monitoring.  Bring weight log to follow-up appointment.  3. 2000mg  sodium dietary restriction.  Bring dietary log to follow-up appointment.  4. Return to HF Infusion Clinic on 05/31/17     I had a long discussion with Larry Pena today about his concerns, condition, and treatment options. All questions from [name] were answered in detail to the extent that Larry Pena understands and agrees with the medical plan. Total visit of 40 minutes with [name] of which 35 minutes were dedicated to advanced HF counseling and care coordination.     Objective:      Visit Diagnosis:   No diagnosis found.      Medications    Current Outpatient Prescriptions on File Prior to Visit   Medication Sig Dispense Refill   ??? albuterol-ipratropium (DUO-NEB, DUO-VENT) 0.5 mg-3 mg(2.5 mg base)/3 mL nebulizer solution Inhale 3 mL solution by nebulizer as directed four times daily.     ??? amiodarone (CORDARONE) 200 mg tablet Take 200 mg by mouth twice daily. Take with food.     ??? apixaban (ELIQUIS) 5 mg tablet Take 5 mg by mouth twice daily.     ??? aspirin EC 81 mg tablet Take 81 mg by mouth daily. Take with food.     ??? Blood Pressure Monitor (BLOOD PRESSURE KIT) kit Use as directed 1 Kit 0   ??? carvedilol (COREG) 3.125 mg tablet Take 3.125 mg by mouth twice daily with meals. Take with food.     ??? cyclobenzaprine (FLEXERIL) 10 mg tablet Take 10 mg by mouth daily as needed.     ??? furosemide (LASIX) 80 mg tablet Please take Lasix 80 mg in the am, 40 mg (1/2 tablet) in the afternoon 45 tablet 5   ??? HYDROcodone/acetaminophen(+) (NORCO) 10/325 mg tablet Take 1 Tab by mouth every 8 hours as needed for Pain     ??? insulin glargine (TOUJEO SOLOSTAR) 300 unit/mL (1.5 mL) injectable Inject 40 Units under the skin at bedtime daily.     ??? lisinopril (PRINIVIL, ZESTRIL) 2.5 mg tablet Take 2.5 mg by mouth daily.     ??? nitroglycerin (NITROSTAT) 0.4 mg tablet Place 0.4 mg under tongue every 5 minutes as needed for Chest Pain. Max of 3 tablets, call 911.     ??? NOVOLOG FLEXPEN U-100 INSULIN 100 unit/mL injection PEN Inject 15 Units under the skin three times daily with meals.  7   ??? omeprazole DR(+) (PRILOSEC) 20 mg capsule Take 20 mg by mouth daily before breakfast.     ??? potassium chloride SR (K-DUR) 10 mEq tablet Take 10 mEq by mouth daily. Take with a meal and a full glass of water. ??? pregabalin (LYRICA) 150 mg capsule Take 150 mg by mouth three times daily.     ??? simvastatin (ZOCOR) 20 mg tablet Take 20 mg by mouth at bedtime daily.     ??? spironolactone (ALDACTONE) 50 mg tablet Take 1 Tab by mouth daily. Take with food.  Indications: CHRONIC HEART FAILURE (Patient taking differently: Take 25 mg by mouth daily. Take with food.) 90 Tab 3     Current Facility-Administered Medications on File Prior to Visit   Medication Dose Route Frequency Provider Last Rate Last Dose   ??? furosemide (LASIX) 500 mg in empty  IV bag 50 mL IV drip syr (max conc)  40-80 mg/hr Intravenous Continuous Ivory Broad, APRN       ??? furosemide (LASIX) injection 40 mg  40 mg Intravenous Q1H X Loleta Books, APRN       ??? magnesium oxide (MAG-OX) tablet 400 mg  400 mg Oral Caprice Renshaw, APRN       ??? potassium chloride SR (K-DUR) tablet 10-60 mEq  10-60 mEq Oral PRN Ivory Broad, APRN               Vitals       There were no vitals filed for this visit.    Wt Readings from Last 20 Encounters:   05/27/17 (!) 139.3 kg (307 lb)   05/27/17 (!) 137.2 kg (302 lb 6.4 oz)   05/25/17 (!) 139.7 kg (308 lb)   05/25/17 (!) 140.6 kg (310 lb)   03/03/17 (!) 137.4 kg (303 lb)   08/06/16 (!) 136.5 kg (301 lb)   12/04/15 120.7 kg (266 lb 1.6 oz)   11/20/15 122.7 kg (270 lb 6.4 oz)   11/20/15 122.7 kg (270 lb 6.4 oz)   11/08/15 122.6 kg (270 lb 3.2 oz)   08/06/15 125.5 kg (276 lb 9.6 oz)   07/08/15 122.5 kg (270 lb)   06/21/15 125.6 kg (277 lb)            Last Echo Result:    Echo Results  (Last 3 results in the past 3 years)    Echo EF LVIDD LA Size IVS LVPW Rest PAP    (11/20/15)  20 (11/20/15)  7.6 (11/20/15)  4.4 (11/20/15)  0.9 (11/20/15)  0.9 (11/20/15)  24                  Physical Exam:    General Appearance: no acute distress  Skin: warm and dry  Digits and Nails: normal color, smooth symmetric nails and digits  Eyes: conjunctivae and lids normal  Neck Veins: JVP 8-10 cm, slight HJR  Chest Inspection: L chest ICD pocket healed Auscultation: breathing comfortably, lungs clear to auscultation, no rales or rhonchi, no wheezing  Cardiac Auscultation: Regular rhythm, S1, S2, no S3 or S4, no murmur  Radial Arteries: normal symmetric radial pulses  Pedal Pulses: normal symmetric pedal pulses  Lower Extremity Edema: no lower extremity edema  Abdominal Exam: soft, round, obese, non-tender, bowel sounds normal  Gait & Station: gait stable  Muscle Strength: normal strength and tone  Orientation: clear historian, good insight           Laboratory Review:     Most Recent Point of Care Results:     Sodium-POC   Date Value Ref Range Status   05/28/2017 139 137 - 147 MMOL/L Final     Potassium-POC   Date Value Ref Range Status   05/28/2017 4.1 3.5 - 5.1 MMOL/L Final     Chloride, POC   Date Value Ref Range Status   05/28/2017 98 98 - 110 MMOL/L Final     CO2, POC   Date Value Ref Range Status   05/28/2017 29 21 - 30 MMOL/L Final     Anion Gap, POC   Date Value Ref Range Status   05/28/2017 16 (H) 3 - 12 Final     Bun, POC   Date Value Ref Range Status   05/28/2017 31 (H) 7 - 25 MG/DL Final     Ionized Calcium-POC   Date Value Ref Range Status  05/28/2017 1.05 1.0 - 1.3 MMOL/L Final     Glucose, POC   Date Value Ref Range Status   05/28/2017 121 (H) 70 - 100 MG/DL Final     BNP POC   Date Value Ref Range Status   05/27/2017 430.0 (H) 0 - 100 PG/ML Final           Satira Anis, [facility]  Pager: [phone]

## 2017-05-28 NOTE — Progress Notes
Patient's creatinine up to 1.8 from 1.5 on 8/23.  Julien Girt, APRN, notified.  Jan to come assess patient.    No infusion today per Jan.  Patient to return to infusion Monday, 8/27.

## 2017-05-31 ENCOUNTER — Encounter: Admit: 2017-05-31 | Discharge: 2017-05-31 | Payer: MEDICAID

## 2017-05-31 NOTE — Telephone Encounter
Patient called to cancel his infusion appt today due to transportation issues. His car broke down and is going to get it fixed today, so he wants to reschedule for tomorrow. Patient rescheduled for infusion appt tomorrow at 11 AM. Patient verbalized understanding and is aware of appt date and time.

## 2017-06-01 ENCOUNTER — Encounter: Admit: 2017-06-01 | Discharge: 2017-06-01 | Payer: MEDICAID

## 2017-06-01 DIAGNOSIS — I429 Cardiomyopathy, unspecified: Principal | ICD-10-CM

## 2017-06-01 DIAGNOSIS — I509 Heart failure, unspecified: ICD-10-CM

## 2017-06-01 DIAGNOSIS — I10 Essential (primary) hypertension: ICD-10-CM

## 2017-06-01 DIAGNOSIS — E119 Type 2 diabetes mellitus without complications: ICD-10-CM

## 2017-06-01 DIAGNOSIS — I739 Peripheral vascular disease, unspecified: ICD-10-CM

## 2017-06-01 LAB — POC BASIC METABOLIC PANEL (BMP)
Lab: 1 MMOL/L (ref 1.0–1.3)
Lab: 1.7 mg/dL — ABNORMAL HIGH (ref 0.4–1.24)
Lab: 138 MMOL/L (ref 137–147)
Lab: 18 — ABNORMAL HIGH (ref 3–12)
Lab: 196 mg/dL — ABNORMAL HIGH (ref 70–100)
Lab: 27 MMOL/L (ref 21–30)
Lab: 27 mg/dL — ABNORMAL HIGH (ref 7–25)
Lab: 4.1 MMOL/L (ref 3.5–5.1)
Lab: 98 MMOL/L (ref 98–110)

## 2017-06-01 MED ORDER — MAGNESIUM OXIDE 400 MG PO TAB
400 mg | Freq: Once | ORAL | 0 refills | Status: CN
Start: 2017-06-01 — End: ?

## 2017-06-01 MED ORDER — POTASSIUM CHLORIDE 10 MEQ PO TBTQ
10-60 meq | ORAL | 0 refills | Status: CN | PRN
Start: 2017-06-01 — End: ?

## 2017-06-01 MED ORDER — MAGNESIUM OXIDE 400 MG PO TAB
400 mg | Freq: Once | ORAL | 0 refills | Status: CP
Start: 2017-06-01 — End: ?
  Administered 2017-06-01: 16:00:00 400 mg via ORAL

## 2017-06-01 MED ORDER — FUROSEMIDE 10 MG/ML IJ SOLN
40 mg | INTRAVENOUS | 0 refills | Status: CP
Start: 2017-06-01 — End: ?
  Administered 2017-06-01 (×3): 40 mg via INTRAVENOUS

## 2017-06-01 MED ORDER — FUROSEMIDE IV DRIP SYR (MAX CONC)
40-80 mg/h | INTRAVENOUS | 0 refills | Status: CN
Start: 2017-06-01 — End: ?

## 2017-06-01 MED ORDER — CHLOROTHIAZIDE SODIUM 500 MG IV SOLR
250-500 mg | INTRAVENOUS | 0 refills | Status: CN | PRN
Start: 2017-06-01 — End: ?

## 2017-06-01 MED ORDER — POTASSIUM CHLORIDE 10 MEQ PO TBTQ
10-60 meq | ORAL | 0 refills | Status: DC | PRN
Start: 2017-06-01 — End: 2017-06-01
  Administered 2017-06-01: 16:00:00 30 meq via ORAL

## 2017-06-01 MED ORDER — FUROSEMIDE IV DRIP SYR (MAX CONC)
40-80 mg/h | INTRAVENOUS | 0 refills | Status: DC
Start: 2017-06-01 — End: 2017-06-01
  Administered 2017-06-01: 16:00:00 40 mg/h via INTRAVENOUS

## 2017-06-01 MED ORDER — FUROSEMIDE 10 MG/ML IJ SOLN
40 mg | INTRAVENOUS | 0 refills | Status: CN
Start: 2017-06-01 — End: ?

## 2017-06-01 NOTE — Patient Education
HF Infusion Clinic - Education & Referrals Flow Sheet         Infusion Day Education Date    Heart Failure Education Folder      Day # 1             HF Video: What is HF?     HF Zone Sheet/Weight Log 06/01/17    Krames: What is HF? 06/01/17    Krames: Tracking Your Weight 06/01/17    Schedule Pharmacy Visit for Day 2         Day #2 HF Video: Medications     Pharmacy Evaluation  05/28/17        Day # 3 HF Video: Nutrition and Exercise     Food Diary     Krames: Changing Your Diet     Krames: Learning to Read Food Labels     Krames: Limiting Fluids     HF Nutrition Therapy Book         Day # 4 HF Video: Learning to [place]: Understanding Advance Care Planning     Did patient give verbal consent to Palliative Vist?         Day #5 Krames: Lab BMP & BNP         Final Day/Graduation  Reinforce Education - Identify Deficits (chart any additional handouts given)        Referrals  Date   Pharmacy  05/28/17   Dietitian     Palliative     SW/CM

## 2017-06-01 NOTE — Progress Notes
Cardiology Infusion Progress Note    Today's Date:  06/01/2017  Name: Larry Pena     MRN: [id number]     HPI:     Larry Pena is a 56 y.o. male.   Patient presents today for third infusion for symptomatic treatment of decompensated heart failure symptoms. Patient has never been in the Infusion clinic before.     Pt was referred to HF Infusion Clinic by Larry Pena after being seen in HF clinic on 05/25/17 and was found to have ~20 lbs excess fluid on board.    05/25/17 (Larry Chales Pena clinic visit) Initial weight: 308 lbs    Today:   Wt Readings from Last 1 Encounters:   06/01/17 135.3 kg (298 lb 3.2 oz)       Today he reports fatigue, lightheadedness and peripheral edema; his cough is improving.     He denies shortness of breath, dyspnea on exertion, paroxysmal nocturnal dyspnea, orthopnea, near syncope, palpitations, chest pain, abdominal fullness and muscle cramping.  He reports taking medication as instructed.       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 BNP on 05/27/17 was 430. He admits to having difficulty following fluid restriction. Diuretic is furosemide 80 mg in AM and 40 mg in PM with Kdur 10 mEq bid. He is s/p CardioMems PA sensor device however has not been using it to transmit PA readings. He reports that he has located his Mems pillow and is working on getting it sent back into the company to obtain a new pillow. Discussed with him that if he obtains a new pillow it will be helpful tool to manage his volume status better.   2. Non-ischemic cardiomyopathy, on suboptimal neurohormonal antagonist (NHA). Currently taking carvedilol 3.125 mg bid, lisinopril 2.5 mg daily, spironolactone 25 mg daily. On 8/24 he was instructed to hold his lisinopril and spironolactone due to elevated renal function. He recently had Echo completed on 8/21 however results are not yet available. Prior echo in 11/2015 showed EF 15-20% with grade I diastolic dysfunction.   3. 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 Creatinine today is 1.7. Will infuse today per protocol (see below). Instructed to continue holding his spironolactone and lisinopril while we are trying to get him diuresed.   4. Coronary Artery Disease surveillance. LHC in 2016 was negative for obstructive CAD. No current symptoms of ischemia.Will continue on his Asa 81 mg daily and simvastatin 20 mg qhs.   5. Hypertension. Blood pressure: 107/65. Currently taking carvedilol 3.125 mg po bid.  6. CRT-D. Recently upgraded in 02/2017. Per last check on 03/03/17 showed 99.6% biventricular pacing with normal device function. Will continue to optimize device function.   7. Atrial arrhythmias. Continues chronic anticoagulation with eliquis 5 mg bid.   8. Chronic low back pain. He is planning to undergo spinal injection in upcoming month with Larry. Bernette Pena in [place], [place] (phone # [phone], fax # [phone]); he is asking for letter to provide Larry Pena stating it's ok for him to be off of his eliquis for 5-7 days prior to having injection. Message sent to Larry Pena with details regarding his request. Per Larry Pena  last OV, he is agreeable to having pt come off eliquis briefly for spinal injection.    9. DM 2. Currently taking Toujeo 40 units subq QHS, Novolog 15 units subq tid with meals. Last HbgA1c 10.0 in 11/2015; he reports he has been getting most of his care at [facility] in [place], [place]. No recent notes in Care Everywhere regarding his diabetes. Today he indicates he would like to get established with Endocrinology at [facility]. Referral sent to [facility] to have him establish for further management. 10. Daytime somnolence. He reports having difficulty sleeping at night, states he often requires naps during the day. Also states he has been told he snores, has never undergone sleep study. He is agreeable to sleep study evaluation, referral sent. Recommended he try melatonin to assist with falling asleep, also discussed sleep hygiene.   Plans    1. Lasix 40 mg IV push x3 doses, Lasix infusion @ 40 mg/hr x3 hrs  2. BMP  3. Goal:  2 liters net output.   4. Goal weight: ~290 lbs      5. Potassium 30 mEq oral today  6. Magnesium 400 mg oral today   7. Ongoing dietary counseling  8. Ongoing pharmacy counseling      Discharge Recommendations:    1. Continue taking lasix 80 mg in AM, 40 mg in PM  2. Continue taking Kdur 10 mEq po bid   3. Continue holding lisinopril and spironolactone  4. Daily weight monitoring.  Bring weight log to follow-up appointment.  5. 2000mg  sodium dietary restriction.  Bring dietary log to follow-up appointment.  6. Return to HF Infusion Clinic on 06/03/17     I had a long discussion with Larry Pena today about his concerns, condition, and treatment options. All questions from Larry Pena were answered in detail to the extent that Larry Pena understands and agrees with the medical plan. Total visit of 45 minutes with Larry Pena.     Objective:      Visit Diagnosis:     Encounter Diagnoses   Name Primary?   ??? Essential hypertension Yes   ??? Acute on chronic systolic (congestive) heart failure (HCC)    ??? Paroxysmal atrial fibrillation (HCC)    ??? Chronic kidney disease, stage 3 (moderate) (HCC)    ??? Daytime somnolence          Medications    Current Outpatient Prescriptions on File Prior to Visit   Medication Sig Dispense Refill   ??? amiodarone (CORDARONE) 200 mg tablet Take 200 mg by mouth twice daily. Take with food. ??? apixaban (ELIQUIS) 5 mg tablet Take 5 mg by mouth twice daily.     ??? aspirin EC 81 mg tablet Take 81 mg by mouth daily. Take with food.      ??? Blood Pressure Monitor (BLOOD PRESSURE KIT) kit Use as directed 1 Kit 0   ??? buPROPion SR(+) (WELLBUTRIN, ZYBAN) 200 mg tablet Take 200 mg by mouth daily.     ??? carvedilol (COREG) 3.125 mg tablet Take 3.125 mg by mouth twice daily with meals. Take with food.      ??? furosemide (LASIX) 80 mg tablet Please take Lasix 80 mg in the am, 40 mg (1/2 tablet) in the afternoon (Patient taking differently: Please take Lasix 80 mg in the am, 40 mg (1/2 tablet) in the afternoon) 45 tablet 5   ???  insulin glargine (TOUJEO SOLOSTAR) 300 unit/mL (1.5 mL) injectable Inject 35 Units under the skin at bedtime daily.     ??? nitroglycerin (NITROSTAT) 0.4 mg tablet Place 0.4 mg under tongue every 5 minutes as needed for Chest Pain. Max of 3 tablets, call 911.      ??? NOVOLOG FLEXPEN U-100 INSULIN 100 unit/mL injection PEN Inject 15 Units under the skin three times daily with meals.  7   ??? omeprazole Larry(+) (PRILOSEC) 40 mg capsule Take 40 mg by mouth daily before breakfast.     ??? potassium chloride SR (K-DUR) 10 mEq tablet Take 10 mEq by mouth twice daily. Take with a meal and a full glass of water.      ??? pregabalin (LYRICA) 150 mg capsule Take 150 mg by mouth three times daily.     ??? simvastatin (ZOCOR) 20 mg tablet Take 20 mg by mouth at bedtime daily.       No current facility-administered medications on file prior to visit.            Vitals       Vitals:    06/01/17 1033   BP: 107/65   Pulse: 70   Resp: 18   SpO2: 95%       Wt Readings from Last 20 Encounters:   06/01/17 135.3 kg (298 lb 3.2 oz)   06/01/17 135.3 kg (298 lb 3.2 oz)   05/28/17 (!) 137.2 kg (302 lb 6.4 oz)   05/28/17 (!) 137.2 kg (302 lb 6.4 oz)   05/27/17 (!) 139.3 kg (307 lb)   05/27/17 (!) 137.2 kg (302 lb 6.4 oz)   05/25/17 (!) 140.6 kg (310 lb)   05/25/17 (!) 139.7 kg (308 lb)   03/03/17 (!) 137.4 kg (303 lb) 08/06/16 (!) 136.5 kg (301 lb)   12/04/15 120.7 kg (266 lb 1.6 oz)   11/20/15 122.7 kg (270 lb 6.4 oz)   11/20/15 122.7 kg (270 lb 6.4 oz)   11/08/15 122.6 kg (270 lb 3.2 oz)   08/06/15 125.5 kg (276 lb 9.6 oz)   07/08/15 122.5 kg (270 lb)   06/21/15 125.6 kg (277 lb)            Last Echo Result:    Echo Results  (Last 3 results in the past 3 years)    Echo EF LVIDD LA Size IVS LVPW Rest PAP    (11/20/15)  20 (11/20/15)  7.6 (11/20/15)  4.4 (11/20/15)  0.9 (11/20/15)  0.9 (11/20/15)  24                  Physical Exam:    General Appearance: no acute distress  Skin: warm and dry  Digits and Nails: normal color, smooth symmetric nails and digits  Eyes: conjunctivae and lids normal  Neck Veins: JVP 8-10 cm, slight HJR  Chest Inspection: L chest ICD pocket healed  Auscultation: breathing comfortably, lungs clear to auscultation, no rales or rhonchi, no wheezing  Cardiac Auscultation: Regular rhythm, S1, S2, no S3 or S4, no murmur  Radial Arteries: normal symmetric radial pulses  Pedal Pulses: normal symmetric pedal pulses  Lower Extremity Edema: no lower extremity edema  Abdominal Exam: soft, round, obese, non-tender, bowel sounds normal  Gait & Station: gait stable  Muscle Strength: normal strength and tone  Orientation: clear historian, good insight           Laboratory Review:     Most Recent Point of Care Results:     Sodium-POC  Date Value Ref Range Status   06/01/2017 138 137 - 147 MMOL/L Final     Potassium-POC   Date Value Ref Range Status   06/01/2017 4.1 3.5 - 5.1 MMOL/L Final     Chloride, POC   Date Value Ref Range Status   06/01/2017 98 98 - 110 MMOL/L Final     CO2, POC   Date Value Ref Range Status   06/01/2017 27 21 - 30 MMOL/L Final     Anion Gap, POC   Date Value Ref Range Status   06/01/2017 18 (H) 3 - 12 Final     Bun, POC   Date Value Ref Range Status   06/01/2017 27 (H) 7 - 25 MG/DL Final     Ionized Calcium-POC   Date Value Ref Range Status   06/01/2017 1.07 1.0 - 1.3 MMOL/L Final Glucose, POC   Date Value Ref Range Status   06/01/2017 196 (H) 70 - 100 MG/DL Final     BNP POC   Date Value Ref Range Status   05/27/2017 430.0 (H) 0 - 100 PG/ML Final           Satira Anis, APRN,FNP-C  Pager: [phone]

## 2017-06-01 NOTE — Telephone Encounter
Called Dr. Rob Bunting office- will fax last OV note outlining anticoagulation instructions.     "He wanted to have a spinal injection, he can come off a apixaban if needed for 5-7 days in order to control his low back pain, he does not need bridging as risk of his spinal complications with anticoagulation exceeds the benefits of anticoagulation which is prevention of stroke."    [phone] Fax   [facility]   Attn: [facility]

## 2017-06-01 NOTE — Telephone Encounter
-----   Message from Julien Girt, [place] sent at 06/01/2017 10:44 AM CDT -----  Regarding: He is asking for clearance letter/note for spinal injection  Hi Dr Lyndel Safe  I am seeing him in Infusion. He is hoping to have spinal injection scheduled next month but his pain team would like a letter clearing him to come off his anticoagulation before they inject. His doctor is "Dr Chrys Racer", looks like in your last OV note you indicated that he would be ok holding his eliquis so I wanted to give you the physician information so we can communicate with them. Thanks  [employer] [phone]  Fax [phone]

## 2017-06-01 NOTE — Progress Notes
Summary for Heart Failure Infusion Clinic   Today's Date: 06/01/2017               Todays Goal: 2 L      Today you diuresed a total of 1,675 ml     Total dose of IV Push: 120 mg of lasix   400 mg of Magnesium   30 mEq of Potassium      IV Infusion dose was: 40 mg/hr x 3 hrs     Pre-infusion weight was: 298.2 lbs     Post-Infusion weight was: 294.8 lbs     Total Net Output:-1,435 ml       Post Infusion Vitals for today:                Blood Pressure:95/58              Heart Rate:68              Oxygen Saturation:98%

## 2017-06-02 ENCOUNTER — Ambulatory Visit: Admit: 2017-06-01 | Discharge: 2017-06-01 | Payer: MEDICAID

## 2017-06-02 ENCOUNTER — Ambulatory Visit: Admit: 2017-06-01 | Discharge: 2017-06-02 | Payer: MEDICAID

## 2017-06-02 DIAGNOSIS — M545 Low back pain: ICD-10-CM

## 2017-06-02 DIAGNOSIS — I429 Cardiomyopathy, unspecified: ICD-10-CM

## 2017-06-02 DIAGNOSIS — I130 Hypertensive heart and chronic kidney disease with heart failure and stage 1 through stage 4 chronic kidney disease, or unspecified chronic kidney disease: ICD-10-CM

## 2017-06-02 DIAGNOSIS — R400 Somnolence: Secondary | ICD-10-CM

## 2017-06-02 DIAGNOSIS — I5023 Acute on chronic systolic (congestive) heart failure: Principal | ICD-10-CM

## 2017-06-02 DIAGNOSIS — N183 Chronic kidney disease, stage 3 (moderate): ICD-10-CM

## 2017-06-03 ENCOUNTER — Ambulatory Visit: Admit: 2017-06-03 | Discharge: 2017-06-03 | Payer: MEDICAID

## 2017-06-03 ENCOUNTER — Ambulatory Visit: Admit: 2017-06-03 | Discharge: 2017-06-03 | Payer: Medicaid Other

## 2017-06-03 DIAGNOSIS — R400 Somnolence: ICD-10-CM

## 2017-06-03 DIAGNOSIS — I110 Hypertensive heart disease with heart failure: ICD-10-CM

## 2017-06-03 DIAGNOSIS — I5023 Acute on chronic systolic (congestive) heart failure: Principal | ICD-10-CM

## 2017-06-03 DIAGNOSIS — N189 Chronic kidney disease, unspecified: ICD-10-CM

## 2017-06-03 DIAGNOSIS — E119 Type 2 diabetes mellitus without complications: ICD-10-CM

## 2017-06-03 LAB — POC BASIC METABOLIC PANEL (BMP): Lab: 136 MMOL/L — ABNORMAL LOW (ref 137–147)

## 2017-06-03 MED ORDER — CHLOROTHIAZIDE SODIUM 500 MG IV SOLR
250-500 mg | INTRAVENOUS | 0 refills | Status: CN | PRN
Start: 2017-06-03 — End: ?

## 2017-06-03 MED ORDER — MAGNESIUM OXIDE 400 MG PO TAB
400 mg | Freq: Once | ORAL | 0 refills | Status: CP
Start: 2017-06-03 — End: ?
  Administered 2017-06-03: 17:00:00 400 mg via ORAL

## 2017-06-03 MED ORDER — FUROSEMIDE 10 MG/ML IJ SOLN
40 mg | INTRAVENOUS | 0 refills | Status: CP
Start: 2017-06-03 — End: ?
  Administered 2017-06-03 (×3): 40 mg via INTRAVENOUS

## 2017-06-03 MED ORDER — MAGNESIUM OXIDE 400 MG PO TAB
400 mg | Freq: Once | ORAL | 0 refills | Status: CN
Start: 2017-06-03 — End: ?

## 2017-06-03 MED ORDER — CHLOROTHIAZIDE SODIUM 500 MG IV SOLR
250-500 mg | INTRAVENOUS | 0 refills | Status: DC | PRN
Start: 2017-06-03 — End: 2017-06-03

## 2017-06-03 MED ORDER — POTASSIUM CHLORIDE 10 MEQ PO TBTQ
10-60 meq | ORAL | 0 refills | Status: CN | PRN
Start: 2017-06-03 — End: ?

## 2017-06-03 MED ORDER — POTASSIUM CHLORIDE 10 MEQ PO TBTQ
10-60 meq | ORAL | 0 refills | Status: DC | PRN
Start: 2017-06-03 — End: 2017-06-03
  Administered 2017-06-03: 17:00:00 40 meq via ORAL

## 2017-06-03 MED ORDER — FUROSEMIDE IV DRIP SYR (MAX CONC)
40-80 mg/h | INTRAVENOUS | 0 refills | Status: CN
Start: 2017-06-03 — End: ?

## 2017-06-03 MED ORDER — FUROSEMIDE 10 MG/ML IJ SOLN
40 mg | INTRAVENOUS | 0 refills | Status: CN
Start: 2017-06-03 — End: ?

## 2017-06-03 MED ORDER — FUROSEMIDE IV DRIP SYR (MAX CONC)
40-80 mg/h | INTRAVENOUS | 0 refills | Status: DC
Start: 2017-06-03 — End: 2017-06-03
  Administered 2017-06-03: 16:00:00 40 mg/h via INTRAVENOUS

## 2017-06-03 NOTE — Progress Notes
Summary for Heart Failure Infusion Clinic   Today's Date: 06/03/2017               Todays Goal: 2 L      Today you diuresed a total of 1,300 ml     Total dose of IV Push: 120 mg of lasix   400 mg of Magnesium   40 mEq of Potassium      IV Infusion dose was: 40 mg/hr x 3 hrs     Pre-infusion weight was: 299.4 lbs     Post-Infusion weight was: 296.2 lbs     Total Net Output:-1,100 ml       Post Infusion Vitals for today:                Blood Pressure:104/60              Heart Rate:69              Oxygen Saturation:97%

## 2017-06-03 NOTE — Progress Notes
Cardiology Infusion Progress Note    Today's Date:  06/03/2017  Name: Larry Pena     MRN: [id number]     HPI:     Larry Pena is a 55 y.o. male.   Patient presents today for fourth infusion for symptomatic treatment of decompensated heart failure symptoms. Patient has never been in the Infusion clinic before.     Pt was referred to HF Infusion Clinic by Dr B.Chales Abrahams after being seen in HF clinic on 05/25/17 and was found to have ~20 lbs excess fluid on board.    05/25/17 (Dr Chales Abrahams clinic visit) Initial weight: 308 lbs  Last infusion: 8/28: 298lbs    Today:   Wt Readings from Last 1 Encounters:   06/01/17 133.7 kg (294 lb 12.8 oz)       Today he reports fatigue, lightheadedness and peripheral edema; his cough is improving.     He denies shortness of breath, dyspnea on exertion, paroxysmal nocturnal dyspnea, orthopnea, near syncope, palpitations, chest pain, abdominal fullness and muscle cramping.  He reports taking medication as instructed.       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 BNP on 05/27/17 was 430. He admits to having difficulty following fluid restriction. Diuretic is furosemide 80 mg in AM and 40 mg in PM with Kdur 10 mEq bid. He is s/p CardioMems PA sensor device however has not been using it to transmit PA readings. His PAD reading today is 37. Reading on 8/21 in clinic was 26. The [employer] rep is checking to see about pillow replacement.    2. Non-ischemic cardiomyopathy, on suboptimal neurohormonal antagonist (NHA). Previously taking carvedilol 3.125 mg bid, lisinopril 2.5 mg daily, spironolactone 25 mg daily. On 8/24 he was instructed to hold his lisinopril and spironolactone due to elevated renal function. He recently had Echo completed on 8/21 however results are not yet available. Prior echo in 11/2015 showed EF 15-20% with grade I diastolic dysfunction. Have requested a read on the echo.  3. 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 Creatinine today is 1.9. With elevated PAD will infuse today per protocol (see below). Instructed to continue holding his spironolactone and lisinopril while we are trying to get him diuresed.   4. Coronary Artery Disease surveillance. LHC in 2016 was negative for obstructive CAD. No current symptoms of ischemia.Will continue on his Asa 81 mg daily and simvastatin 20 mg qhs.   5. Hypertension. Blood pressure: 105/50. Currently taking carvedilol 3.125 mg po bid.  6. CRT-D. Recently upgraded in 02/2017. Per last check on 03/03/17 showed 99.6% biventricular pacing with normal device function. Will continue to optimize device function.   7. Atrial arrhythmias. Continues chronic anticoagulation with eliquis 5 mg bid.   8. Chronic low back pain. He is planning to undergo spinal injection in upcoming month with Dr. Bernette Mayers in [place], [place] (phone # [phone], fax # [phone]); he is asking for letter to provide Dr Bernette Mayers stating it's ok for him to be off of his eliquis for 5-7 days prior to having injection. Message sent to Dr B.Chales Abrahams with details regarding his request. Per Dr Urban Gibson last OV, he is agreeable to having pt  come off eliquis briefly for spinal injection.    9. DM 2. Currently taking Toujeo 40 units subq QHS, Novolog 15 units subq tid with meals. Last HbgA1c 10.0 in 11/2015; he reports he has been getting most of his care at [facility] in [place], [place]. No recent notes in Care Everywhere regarding his diabetes. Today he indicates he would like to get established with Endocrinology at [facility]. Referral sent to [facility] to have him establish for further management. 10. Daytime somnolence. He reports having difficulty sleeping at night, states he often requires naps during the day. Also states he has been told he snores, has never undergone sleep study. He is agreeable to sleep study evaluation, referral sent. Recommended he try melatonin to assist with falling asleep, also discussed sleep hygiene.   Plans    1. Lasix 40 mg IV push x3 doses, Lasix infusion @ 40 mg/hr x3 hrs  2. BMP  3. Goal:  2 liters net output.   4. Goal weight: ~290 lbs      5. Potassium 40 mEq oral today  6. Magnesium 400 mg oral today   7. Ongoing dietary counseling  8. Ongoing pharmacy counseling      Discharge Recommendations:    1. Continue taking lasix 80 mg in AM, 40 mg in PM  2. Continue taking Kdur 10 mEq po bid   3. Continue holding lisinopril and spironolactone  4. Daily weight monitoring.  Bring weight log to follow-up appointment.  5. 2000mg  sodium dietary restriction.  Bring dietary log to follow-up appointment.  6. Return to HF Infusion Clinic on 06/04/17     I had a long discussion with Larry Pena today about his concerns, condition, and treatment options. All questions from Larry Pena were answered in detail to the extent that Larry Pena understands and agrees with the medical plan. Total visit of 45 minutes with Larry Pena of which 35 minutes were dedicated to advanced HF counseling and care coordination.     Objective:      Visit Diagnosis:     Encounter Diagnoses   Name Primary?   ??? Acute on chronic systolic (congestive) heart failure (HCC) Yes         Medications    Current Outpatient Prescriptions on File Prior to Visit   Medication Sig Dispense Refill   ??? amiodarone (CORDARONE) 200 mg tablet Take 200 mg by mouth twice daily. Take with food.      ??? apixaban (ELIQUIS) 5 mg tablet Take 5 mg by mouth twice daily.     ??? aspirin EC 81 mg tablet Take 81 mg by mouth daily. Take with food. ??? Blood Pressure Monitor (BLOOD PRESSURE KIT) kit Use as directed 1 Kit 0   ??? buPROPion SR(+) (WELLBUTRIN, ZYBAN) 200 mg tablet Take 200 mg by mouth daily.     ??? carvedilol (COREG) 3.125 mg tablet Take 3.125 mg by mouth twice daily with meals. Take with food.      ??? furosemide (LASIX) 80 mg tablet Please take Lasix 80 mg in the am, 40 mg (1/2 tablet) in the afternoon (Patient taking differently: Please take Lasix 80 mg in the am, 40 mg (1/2 tablet) in the afternoon) 45 tablet 5   ??? insulin glargine (TOUJEO SOLOSTAR) 300 unit/mL (1.5 mL) injectable Inject 35 Units under the skin at bedtime daily.     ??? nitroglycerin (NITROSTAT) 0.4 mg tablet Place 0.4 mg under tongue every 5 minutes as needed for  Chest Pain. Max of 3 tablets, call 911.      ??? NOVOLOG FLEXPEN U-100 INSULIN 100 unit/mL injection PEN Inject 15 Units under the skin three times daily with meals.  7   ??? omeprazole DR(+) (PRILOSEC) 40 mg capsule Take 40 mg by mouth daily before breakfast.     ??? potassium chloride SR (K-DUR) 10 mEq tablet Take 10 mEq by mouth twice daily. Take with a meal and a full glass of water.      ??? pregabalin (LYRICA) 150 mg capsule Take 150 mg by mouth three times daily.     ??? simvastatin (ZOCOR) 20 mg tablet Take 20 mg by mouth at bedtime daily.       Current Facility-Administered Medications on File Prior to Visit   Medication Dose Route Frequency Provider Last Rate Last Dose   ??? chlorothiazide injection 250-500 mg  250-500 mg Intravenous PRN Ivory Broad, APRN       ??? furosemide (LASIX) 500 mg in empty IV bag 50 mL IV drip syr (max conc)  40-80 mg/hr Intravenous Continuous Ivory Broad, APRN 4 mL/hr at 06/03/17 1129 40 mg/hr at 06/03/17 1129   ??? furosemide (LASIX) injection 40 mg  40 mg Intravenous Q1H X Loleta Books, APRN   40 mg at 06/03/17 1152   ??? magnesium oxide (MAG-OX) tablet 400 mg  400 mg Oral Caprice Renshaw, APRN       ??? potassium chloride SR (K-DUR) tablet 10-60 mEq  10-60 mEq Oral PRN Ivory Broad, APRN Vitals       There were no vitals filed for this visit.    Wt Readings from Last 20 Encounters:   06/01/17 133.7 kg (294 lb 12.8 oz)   06/01/17 135.3 kg (298 lb 3.2 oz)   05/28/17 (!) 137.2 kg (302 lb 6.4 oz)   05/28/17 (!) 137.2 kg (302 lb 6.4 oz)   05/27/17 (!) 139.3 kg (307 lb)   05/27/17 (!) 137.2 kg (302 lb 6.4 oz)   05/25/17 (!) 140.6 kg (310 lb)   05/25/17 (!) 139.7 kg (308 lb)   03/03/17 (!) 137.4 kg (303 lb)   08/06/16 (!) 136.5 kg (301 lb)   12/04/15 120.7 kg (266 lb 1.6 oz)   11/20/15 122.7 kg (270 lb 6.4 oz)   11/20/15 122.7 kg (270 lb 6.4 oz)   11/08/15 122.6 kg (270 lb 3.2 oz)   08/06/15 125.5 kg (276 lb 9.6 oz)   07/08/15 122.5 kg (270 lb)   06/21/15 125.6 kg (277 lb)            Last Echo Result:    Echo Results  (Last 3 results in the past 3 years)    Echo EF LVIDD LA Size IVS LVPW Rest PAP    (11/20/15)  20 (11/20/15)  7.6 (11/20/15)  4.4 (11/20/15)  0.9 (11/20/15)  0.9 (11/20/15)  24                  Physical Exam:    General Appearance: no acute distress  Skin: warm and dry  Digits and Nails: normal color, smooth symmetric nails and digits  Eyes: conjunctivae and lids normal  Neck Veins: JVP 8-10 cm, slight HJR  Chest Inspection: L chest ICD pocket healed  Auscultation: breathing comfortably, lungs clear to auscultation, no rales or rhonchi, no wheezing  Cardiac Auscultation: Regular rhythm, S1, S2, no S3 or S4, no murmur  Radial Arteries: normal symmetric radial pulses  Pedal Pulses: normal symmetric pedal pulses  Lower Extremity Edema: 1-20mm bilat edema  Abdominal Exam: soft, round, obese, non-tender, bowel sounds normal  Gait & Station: gait stable, uses cane  Muscle Strength: normal strength and tone  Orientation: clear historian, good insight           Laboratory Review:     Most Recent Point of Care Results:     Sodium-POC   Date Value Ref Range Status   06/03/2017 136 (L) 137 - 147 MMOL/L Final     Potassium-POC   Date Value Ref Range Status   06/03/2017 4.0 3.5 - 5.1 MMOL/L Final Chloride, POC   Date Value Ref Range Status   06/03/2017 99 98 - 110 MMOL/L Final     CO2, POC   Date Value Ref Range Status   06/03/2017 26 21 - 30 MMOL/L Final     Anion Gap, POC   Date Value Ref Range Status   06/03/2017 16 (H) 3 - 12 Final     Bun, POC   Date Value Ref Range Status   06/03/2017 32 (H) 7 - 25 MG/DL Final     Ionized Calcium-POC   Date Value Ref Range Status   06/03/2017 0.98 (L) 1.0 - 1.3 MMOL/L Final     Glucose, POC   Date Value Ref Range Status   06/03/2017 172 (H) 70 - 100 MG/DL Final     BNP POC   Date Value Ref Range Status   05/27/2017 430.0 (H) 0 - 100 PG/ML Final           Walker Shadow, APRN-C  Pager: [phone]

## 2017-06-04 ENCOUNTER — Ambulatory Visit: Admit: 2017-06-04 | Discharge: 2017-06-04 | Payer: MEDICAID

## 2017-06-04 DIAGNOSIS — I5023 Acute on chronic systolic (congestive) heart failure: Principal | ICD-10-CM

## 2017-06-04 DIAGNOSIS — Z95810 Presence of automatic (implantable) cardiac defibrillator: ICD-10-CM

## 2017-06-04 DIAGNOSIS — I480 Paroxysmal atrial fibrillation: ICD-10-CM

## 2017-06-04 DIAGNOSIS — I5022 Chronic systolic (congestive) heart failure: ICD-10-CM

## 2017-06-04 LAB — POC BASIC METABOLIC PANEL (BMP)
Lab: 138 MMOL/L (ref 137–147)
Lab: 30 MMOL/L (ref 21–30)
Lab: 4.4 MMOL/L (ref 3.5–5.1)
Lab: 98 MMOL/L (ref 98–110)

## 2017-06-04 MED ORDER — SPIRONOLACTONE 25 MG PO TAB
25 mg | ORAL_TABLET | Freq: Every day | ORAL | 3 refills | 90.00000 days | Status: AC
Start: 2017-06-04 — End: 2017-11-30

## 2017-06-04 MED ORDER — POTASSIUM CHLORIDE 10 MEQ PO TBTQ
10-60 meq | ORAL | 0 refills | Status: CN | PRN
Start: 2017-06-04 — End: ?

## 2017-06-04 MED ORDER — FUROSEMIDE IV DRIP SYR (MAX CONC)
40-80 mg/h | INTRAVENOUS | 0 refills | Status: DC
Start: 2017-06-04 — End: 2017-06-04
  Administered 2017-06-04: 16:00:00 40 mg/h via INTRAVENOUS

## 2017-06-04 MED ORDER — CHLOROTHIAZIDE SODIUM 500 MG IV SOLR
250-500 mg | INTRAVENOUS | 0 refills | Status: CN | PRN
Start: 2017-06-04 — End: ?

## 2017-06-04 MED ORDER — FUROSEMIDE 10 MG/ML IJ SOLN
40 mg | INTRAVENOUS | 0 refills | Status: DC
Start: 2017-06-04 — End: 2017-06-04
  Administered 2017-06-04 (×2): 40 mg via INTRAVENOUS

## 2017-06-04 MED ORDER — MAGNESIUM OXIDE 400 MG PO TAB
400 mg | Freq: Once | ORAL | 0 refills | Status: CP
Start: 2017-06-04 — End: ?
  Administered 2017-06-04: 16:00:00 400 mg via ORAL

## 2017-06-04 MED ORDER — POTASSIUM CHLORIDE 10 MEQ PO TBTQ
10-60 meq | ORAL | 0 refills | Status: DC | PRN
Start: 2017-06-04 — End: 2017-06-04
  Administered 2017-06-04: 16:00:00 20 meq via ORAL

## 2017-06-04 MED ORDER — FUROSEMIDE 10 MG/ML IJ SOLN
40 mg | INTRAVENOUS | 0 refills | Status: CN
Start: 2017-06-04 — End: ?

## 2017-06-04 MED ORDER — AMIODARONE 200 MG PO TAB
200 mg | ORAL_TABLET | Freq: Every day | ORAL | 3 refills | 42.00000 days | Status: AC
Start: 2017-06-04 — End: 2018-02-04

## 2017-06-04 MED ORDER — BUMETANIDE 2 MG PO TAB
2 mg | ORAL_TABLET | Freq: Two times a day (BID) | ORAL | 5 refills | Status: AC
Start: 2017-06-04 — End: 2017-06-08

## 2017-06-04 MED ORDER — FUROSEMIDE IV DRIP SYR (MAX CONC)
40-80 mg/h | INTRAVENOUS | 0 refills | Status: CN
Start: 2017-06-04 — End: ?

## 2017-06-04 MED ORDER — FUROSEMIDE 10 MG/ML IJ SOLN
80 mg | Freq: Once | INTRAVENOUS | 0 refills | Status: CP
Start: 2017-06-04 — End: ?
  Administered 2017-06-04: 18:00:00 80 mg via INTRAVENOUS

## 2017-06-04 MED ORDER — MAGNESIUM OXIDE 400 MG PO TAB
400 mg | Freq: Once | ORAL | 0 refills | Status: CN
Start: 2017-06-04 — End: ?

## 2017-06-04 MED ORDER — FUROSEMIDE 10 MG/ML IJ SOLN
80 mg | INTRAVENOUS | 0 refills | Status: CN
Start: 2017-06-04 — End: ?

## 2017-06-04 MED ORDER — CHLOROTHIAZIDE SODIUM 500 MG IV SOLR
250-500 mg | INTRAVENOUS | 0 refills | Status: DC | PRN
Start: 2017-06-04 — End: 2017-06-04

## 2017-06-04 NOTE — Progress Notes
Summary for Heart Failure Infusion Clinic   Today's Date: 06/04/2017               Todays Goal: 2 L      Today you diuresed a total of 1,100 ml     Total dose of IV Push: 120 mg of lasix   400 mg of Magnesium   20 mEq of Potassium      IV Infusion dose was: 40 mg/hr x 3 hrs     Pre-infusion weight was: 297.8 lbs     Post-Infusion weight was: 294.4 lbs     Total Net Output:-980 ml       Post Infusion Vitals for today:                Blood Pressure:107/59              Heart Rate:69              Oxygen Saturation:96%

## 2017-06-04 NOTE — Progress Notes
Cardiology Infusion Progress Note    Today's Date:  06/04/2017  Name: Larry Pena     MRN: [id number]     HPI:     Larry Pena is a 56 y.o. male.   Patient presents today for fifth infusion for symptomatic treatment of decompensated heart failure symptoms. Patient has never been in the Infusion clinic before.     Pt was referred to HF Infusion Clinic by Dr B.Chales Abrahams after being seen in HF clinic on 05/25/17 and was found to have ~20 lbs excess fluid on board.    05/25/17 (Dr Chales Abrahams clinic visit) Initial weight: 308 lbs  Last infusion: 8/30: 299 lbs    Today:   Wt Readings from Last 1 Encounters:   06/04/17 135.1 kg (297 lb 12.8 oz)       Today he reports fatigue, abdominal fullness and peripheral edema; his cough is improving.     He denies shortness of breath, paroxysmal nocturnal dyspnea, orthopnea, near syncope, palpitations, chest pain and muscle cramping.  He reports taking medication as instructed.       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 BNP on 05/27/17 was 430. He admits to having difficulty following fluid restriction but is making an effort at this time.  Diuretic is furosemide 80 mg in AM and 40 mg in PM. Rather than escalate furosemide further, will stop it and switch to bumex 2mg  PO BID starting this weekend.  He is s/p CardioMems PA sensor device however has not been using it to transmit PA readings. His PAD reading today is 39, yesterday was 37. Reading on 8/21 in clinic was 26. The [employer] team has left him a message at home to discuss pillow replacement. It will be up to patient to work out those details. Discussed it would be very helpful in future to have daily readings to help US guide his diuretic regimen.   2. Non-ischemic cardiomyopathy, on suboptimal neurohormonal antagonist (NHA). Previously taking carvedilol 3.125 mg bid, lisinopril 2.5 mg daily, spironolactone 25 mg daily. On 8/24 he was instructed to hold his lisinopril and spironolactone due to elevated renal function. Will restart spironolactone 25mg  daily over the weekend. He recently had Echo completed on 8/21 however results are not yet available. Prior echo in 11/2015 showed EF 15-20% with grade I diastolic dysfunction. Have requested a read on the echo.  3. 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 Creatinine today is 1.9. With elevated PAD will infuse today per protocol (see below). Instructed to continue holding his lisinopril while we are trying to get him diuresed.   4. Coronary Artery Disease surveillance. LHC in 2016 was negative for obstructive CAD. No current symptoms of ischemia.Will continue on his Asa 81 mg daily and simvastatin 20 mg qhs.   5. Hypertension. Blood pressure: 108/62. Currently taking carvedilol 3.125 mg po bid.  6. CRT-D. Recently upgraded in 02/2017. Per last check on 03/03/17 showed 99.6% biventricular pacing with normal device function. Will request f/u ICD check next week.   7. Atrial arrhythmias. Continues chronic anticoagulation with eliquis 5 mg bid. Appears he may have been on amiodarone 200mg  BID since his initial episode  of afib and cardioversion at [facility] back in 2017. Will reduce amiodarone to 200mg  daily and will need f/u LFTs, Thyroid studies and PFT or CXR in near future for baseline monitoring since he wants to continue care here at [facility].   8. Chronic low back pain. He is planning to undergo spinal injection in upcoming month with Dr. Bernette Mayers in [place], [place] (phone # [phone], fax # [phone]); he is asking for letter to provide Dr Bernette Mayers stating it's ok for him to be off of his eliquis for 5-7 days prior to having injection. Message sent to Dr B.Chales Abrahams with details regarding his request. Per Dr Urban Gibson last OV, he is agreeable to having pt come off eliquis briefly for spinal injection.    9. DM 2. Currently taking Toujeo 40 units subq QHS, Novolog 15 units subq tid with meals. Last HbgA1c 10.0 in 11/2015; he reports he has been getting most of his care at [facility] in [place], [place]. No recent notes in Care Everywhere regarding his diabetes. Today he indicates he would like to get established with Endocrinology at [facility]. Referral sent to [facility] to have him establish for further management.   10. Daytime somnolence. He reports having difficulty sleeping at night, states he often requires naps during the day. Also states he has been told he snores, has never undergone sleep study. He is agreeable to sleep study evaluation, referral sent. Recommended he try melatonin to assist with falling asleep, also discussed sleep hygiene.   Plans    1. Lasix 40 mg IV push x3 doses, Lasix infusion @ 40 mg/hr x3 hrs  2. BMP  3. Goal:  2 liters net output.   4. Goal CardioMEMS PAD - 20 or less   5. Potassium 20 mEq oral today  6. Magnesium 400 mg oral today   7. Ongoing dietary counseling  8. Ongoing pharmacy counseling      Discharge Recommendations:    1. Stop lasix  2. Start bumex 2mg  BID  3. Continue taking Kdur 10 mEq po bid   4. Continue holding lisinopril   5. Restart spironolactone 25mg  qd  6. Reduce amiodarone to 200mg  qd  7. Daily weight monitoring.  Bring weight log to follow-up appointment.  8. 2000mg  sodium dietary restriction.  Bring dietary log to follow-up appointment.  9. Return to HF Infusion Clinic on 06/08/17  10. ICD check, LFTs and TSH on 06/08/17  11. Call [employer] remote to discuss pillow replacement    I had a long discussion with [name] today about his concerns, condition, and treatment options. All questions from [name] were answered in detail to the extent that [name] understands and agrees with the medical plan. Total visit of 45 minutes with [name] of which 35 minutes were dedicated to advanced HF counseling and care coordination.     Objective:      Visit Diagnosis:     Encounter Diagnoses   Name Primary?   ??? Chronic systolic heart failure (HCC) Yes   ??? Paroxysmal atrial fibrillation (HCC)    ??? Cardiac defibrillator in place    ??? Acute on chronic systolic (congestive) heart failure (HCC)          Medications    Current Outpatient Prescriptions on File Prior to Visit   Medication Sig Dispense Refill   ??? apixaban (ELIQUIS) 5 mg tablet Take 5 mg by mouth twice daily.     ??? aspirin EC 81 mg  tablet Take 81 mg by mouth daily. Take with food.      ??? Blood Pressure Monitor (BLOOD PRESSURE KIT) kit Use as directed 1 Kit 0   ??? buPROPion SR(+) (WELLBUTRIN, ZYBAN) 200 mg tablet Take 200 mg by mouth daily.     ??? carvedilol (COREG) 3.125 mg tablet Take 3.125 mg by mouth twice daily with meals. Take with food.      ??? HYDROcodone/acetaminophen(+) (NORCO) 10/325 mg tablet Take 1 tablet by mouth every 6 hours as needed for Pain     ??? insulin glargine (TOUJEO SOLOSTAR) 300 unit/mL (1.5 mL) injectable Inject 35 Units under the skin at bedtime daily.     ??? nitroglycerin (NITROSTAT) 0.4 mg tablet Place 0.4 mg under tongue every 5 minutes as needed for Chest Pain. Max of 3 tablets, call 911.      ??? NOVOLOG FLEXPEN U-100 INSULIN 100 unit/mL injection PEN Inject 15 Units under the skin three times daily with meals.  7   ??? omeprazole DR(+) (PRILOSEC) 40 mg capsule Take 40 mg by mouth daily before breakfast.     ??? potassium chloride SR (K-DUR) 10 mEq tablet Take 10 mEq by mouth twice daily. Take with a meal and a full glass of water.      ??? pregabalin (LYRICA) 150 mg capsule Take 150 mg by mouth three times daily.     ??? simvastatin (ZOCOR) 20 mg tablet Take 20 mg by mouth at bedtime daily. No current facility-administered medications on file prior to visit.            Vitals       Vitals:    06/04/17 1100   BP: 108/62   Pulse: 69   Resp: 20   SpO2: 94%       Wt Readings from Last 20 Encounters:   06/04/17 135.1 kg (297 lb 12.8 oz)   06/04/17 135.1 kg (297 lb 12.8 oz)   06/03/17 135.8 kg (299 lb 6.4 oz)   06/03/17 134.4 kg (296 lb 3.2 oz)   06/01/17 133.7 kg (294 lb 12.8 oz)   06/01/17 135.3 kg (298 lb 3.2 oz)   05/28/17 (!) 137.2 kg (302 lb 6.4 oz)   05/28/17 (!) 137.2 kg (302 lb 6.4 oz)   05/27/17 (!) 139.3 kg (307 lb)   05/27/17 (!) 137.2 kg (302 lb 6.4 oz)   05/25/17 (!) 140.6 kg (310 lb)   05/25/17 (!) 139.7 kg (308 lb)   03/03/17 (!) 137.4 kg (303 lb)   08/06/16 (!) 136.5 kg (301 lb)   12/04/15 120.7 kg (266 lb 1.6 oz)   11/20/15 122.7 kg (270 lb 6.4 oz)   11/20/15 122.7 kg (270 lb 6.4 oz)   11/08/15 122.6 kg (270 lb 3.2 oz)   08/06/15 125.5 kg (276 lb 9.6 oz)   07/08/15 122.5 kg (270 lb)            Last Echo Result:    Echo Results  (Last 3 results in the past 3 years)    Echo EF LVIDD LA Size IVS LVPW Rest PAP    (05/25/17)  25 (05/25/17)  6.81 (05/25/17)  5.79 (05/25/17)  1.32 (05/25/17)  1.22 (05/25/17)  41    (11/20/15)  20 (11/20/15)  7.6 (11/20/15)  4.4 (11/20/15)  0.9 (11/20/15)  0.9 (11/20/15)  24                  Physical Exam:    General Appearance: no acute distress  Skin:  warm and dry  Digits and Nails: normal color, smooth symmetric nails and digits  Eyes: conjunctivae and lids normal  Neck Veins: JVP 8-10 cm, slight HJR  Chest Inspection: L chest ICD pocket healed  Auscultation: breathing comfortably, lungs clear to auscultation, no rales or rhonchi, no wheezing  Cardiac Auscultation: Regular rhythm, S1, S2, no S3 or S4, no murmur  Radial Arteries: normal symmetric radial pulses  Pedal Pulses: normal symmetric pedal pulses  Lower Extremity Edema: 1-21mm bilat edema  Abdominal Exam: soft, round, obese, non-tender, bowel sounds normal  Gait & Station: gait stable, uses cane Muscle Strength: normal strength and tone  Orientation: clear historian, good insight           Laboratory Review:     Most Recent Point of Care Results:     Sodium-POC   Date Value Ref Range Status   06/04/2017 138 137 - 147 MMOL/L Final     Potassium-POC   Date Value Ref Range Status   06/04/2017 4.4 3.5 - 5.1 MMOL/L Final     Chloride, POC   Date Value Ref Range Status   06/04/2017 98 98 - 110 MMOL/L Final     CO2, POC   Date Value Ref Range Status   06/04/2017 30 21 - 30 MMOL/L Final     Anion Gap, POC   Date Value Ref Range Status   06/04/2017 15 (H) 3 - 12 Final     Bun, POC   Date Value Ref Range Status   06/04/2017 32 (H) 7 - 25 MG/DL Final     Ionized Calcium-POC   Date Value Ref Range Status   06/04/2017 1.00 1.0 - 1.3 MMOL/L Final     Glucose, POC   Date Value Ref Range Status   06/04/2017 156 (H) 70 - 100 MG/DL Final     BNP POC   Date Value Ref Range Status   05/27/2017 430.0 (H) 0 - 100 PG/ML Final           Walker Shadow, APRN-C  Pager: [phone]

## 2017-06-04 NOTE — Progress Notes
Verbal order from Harlene Salts, APRN to increase patient's lasix gtt to 80 mg/hr for last hour and give 80 mg IV lasix bolus for last bolus.

## 2017-06-08 ENCOUNTER — Encounter: Admit: 2017-06-08 | Discharge: 2017-06-09 | Payer: MEDICAID

## 2017-06-08 ENCOUNTER — Ambulatory Visit: Admit: 2017-06-08 | Discharge: 2017-06-08 | Payer: MEDICAID

## 2017-06-08 DIAGNOSIS — Z95810 Presence of automatic (implantable) cardiac defibrillator: ICD-10-CM

## 2017-06-08 DIAGNOSIS — I5022 Chronic systolic (congestive) heart failure: ICD-10-CM

## 2017-06-08 DIAGNOSIS — I480 Paroxysmal atrial fibrillation: Secondary | ICD-10-CM

## 2017-06-08 DIAGNOSIS — I5023 Acute on chronic systolic (congestive) heart failure: Principal | ICD-10-CM

## 2017-06-08 LAB — LIVER FUNCTION PANEL
Lab: 0.2 mg/dL (ref <0.4)
Lab: 0.7 mg/dL (ref 0.3–1.2)
Lab: 50 U/L (ref 25–110)

## 2017-06-08 LAB — TSH WITH FREE T4 REFLEX: Lab: 1.3 uU/mL (ref 0.35–5.00)

## 2017-06-08 LAB — POC BASIC METABOLIC PANEL (BMP)
Lab: 137 MMOL/L (ref 137–147)
Lab: 16 — ABNORMAL HIGH (ref 3–12)
Lab: 29 MMOL/L (ref 21–30)
Lab: 4 MMOL/L (ref 3.5–5.1)
Lab: 97 MMOL/L — ABNORMAL LOW (ref 98–110)

## 2017-06-08 MED ORDER — FUROSEMIDE IV DRIP SYR (MAX CONC)
40-80 mg/h | INTRAVENOUS | 0 refills | Status: DC
Start: 2017-06-08 — End: 2017-06-08
  Administered 2017-06-08: 16:00:00 80 mg/h via INTRAVENOUS

## 2017-06-08 MED ORDER — FUROSEMIDE IV DRIP SYR (MAX CONC)
40-80 mg/h | INTRAVENOUS | 0 refills | Status: CN
Start: 2017-06-08 — End: ?

## 2017-06-08 MED ORDER — CHLOROTHIAZIDE SODIUM 500 MG IV SOLR
250-500 mg | INTRAVENOUS | 0 refills | Status: CN | PRN
Start: 2017-06-08 — End: ?

## 2017-06-08 MED ORDER — POTASSIUM CHLORIDE 10 MEQ PO TBTQ
10-60 meq | ORAL | 0 refills | Status: DC | PRN
Start: 2017-06-08 — End: 2017-06-08
  Administered 2017-06-08: 16:00:00 40 meq via ORAL

## 2017-06-08 MED ORDER — MAGNESIUM OXIDE 400 MG PO TAB
400 mg | Freq: Once | ORAL | 0 refills | Status: CN
Start: 2017-06-08 — End: ?

## 2017-06-08 MED ORDER — POTASSIUM CHLORIDE 10 MEQ PO TBTQ
10-60 meq | ORAL | 0 refills | Status: CN | PRN
Start: 2017-06-08 — End: ?

## 2017-06-08 MED ORDER — MAGNESIUM OXIDE 400 MG PO TAB
400 mg | Freq: Once | ORAL | 0 refills | Status: CP
Start: 2017-06-08 — End: ?
  Administered 2017-06-08: 16:00:00 400 mg via ORAL

## 2017-06-08 MED ORDER — CHLOROTHIAZIDE SODIUM 500 MG IV SOLR
250-500 mg | INTRAVENOUS | 0 refills | Status: DC | PRN
Start: 2017-06-08 — End: 2017-06-08

## 2017-06-08 MED ORDER — BUMETANIDE 1 MG PO TAB
2 mg | Freq: Two times a day (BID) | ORAL | 0 refills | Status: AC
Start: 2017-06-08 — End: 2017-06-21

## 2017-06-08 MED ORDER — FUROSEMIDE 10 MG/ML IJ SOLN
80 mg | INTRAVENOUS | 0 refills | Status: CN
Start: 2017-06-08 — End: ?

## 2017-06-08 MED ORDER — FUROSEMIDE 10 MG/ML IJ SOLN
80 mg | INTRAVENOUS | 0 refills | Status: CP
Start: 2017-06-08 — End: ?
  Administered 2017-06-08 (×3): 80 mg via INTRAVENOUS

## 2017-06-08 NOTE — Progress Notes
Cardiology Infusion Progress Note    Today's Date:  06/08/2017  Name: Larry Pena     MRN: [id number]     HPI:     Larry Pena is a 56 y.o. male.   Patient presents today for sixth infusion for symptomatic treatment of decompensated heart failure symptoms. Patient has never been in the Infusion clinic before.     Pt was referred to HF Infusion Clinic by Dr B.Chales Abrahams after being seen in HF clinic on 05/25/17 and was found to have ~20 lbs excess fluid on board.    05/25/17 (Dr Chales Abrahams clinic visit) Initial weight: 308 lbs  Last infusion: 8/31: 297.8 lbs    Today:   Wt Readings from Last 1 Encounters:   06/04/17 135.1 kg (297 lb 12.8 oz)       Today he reports fatigue, abdominal fullness and peripheral edema; his cough is improving.     He denies shortness of breath, paroxysmal nocturnal dyspnea, orthopnea, near syncope, palpitations, chest pain and muscle cramping.  He reports taking medication as instructed.  He reports making changes in amiodarone, spironolactone and bumex as directed last Friday.     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  Diuretic is bumex 2mg  PO BID starting last Saturday 9/1. He doesn't feel he has had as much UO on bumex as prior furosemide but his home weight is down from 291 on Fri to 288 this am so for now will continue same dose- suspect he may need escalation to 3mg  BID over this weekend.  He is s/p CardioMems PA sensor device however has not been using it to transmit PA readings. His PAD reading today is 31, down from upper 30-low 40s end of last week. The [employer] team has left him a message at home to discuss pillow replacement. It will be up to patient to work out those details. Discussed it would be very helpful in future to have daily readings to help US guide his diuretic regimen.   2. Non-ischemic cardiomyopathy, on suboptimal neurohormonal antagonist (NHA). Previously taking carvedilol 3.125 mg bid, lisinopril 2.5 mg daily, spironolactone 25 mg daily. On 8/24 he was instructed to hold his lisinopril and spironolactone due to elevated renal function. We restarted spironolactone 25mg  daily on 9/1. He recently had Echo completed on 8/21 with LVEF 25%, LV 6.8cm and mild-mod AI with moderate dilation of ascending aorta. Prior echo in 11/2015 showed EF 15-20% with grade I diastolic dysfunction.   3. 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 Creatinine today is 1.8. Recent range has been 1.5-1.9. With elevated PAD will infuse today per protocol (see below). Instructed to continue holding his lisinopril while we are trying to get him diuresed.   4. Coronary Artery Disease surveillance. LHC in 2016 was negative for obstructive CAD. No current symptoms of ischemia.Will continue on his Asa 81 mg daily and simvastatin 20 mg qhs.   5. Hypertension. Blood pressure: 123/71. Currently taking carvedilol 3.125 mg po bid.  6. CRT-D. Recently upgraded to CRT-D summer of 2017 per [facility] cardiology at [facility]. Interrogation today reveals 99.8% bi-v pacing, no atrial or ventricular arrhythmias and improvement in thoracic impedence in past few days.  7. Atrial arrhythmias. Continues chronic anticoagulation with eliquis 5 mg bid. Appears he may have been on amiodarone 200mg  BID since his initial episode of afib and cardioversion at [facility] back in 2017. Reduced amiodarone to 200mg  daily on 8/31 and LFTs, Thyroid studies today are normal. Will need PFT or CXR in near future for baseline monitoring since he wants to continue care here at [facility].   8. Chronic low back pain. He is planning to undergo spinal injection in upcoming month with Dr. Bernette Mayers in [place], [place] (phone # [phone], fax # [phone]); [facility] has faxed a letter to Dr Bernette Mayers stating it's ok for him to be off of his eliquis for 5-7 days prior to having spinal injection.   9. DM 2. Currently taking Toujeo 40 units subq QHS, Novolog 15 units subq tid with meals. Last HbgA1c 10.0 in 11/2015; he reports he has been getting most of his care at [facility] in [place], [place]. No recent notes in Care Everywhere regarding his diabetes. Today he indicates he would like to get established with Endocrinology at [facility]. Referral sent to [facility] to have him establish for further management.   10. Daytime somnolence. He reports having difficulty sleeping at night, states he often requires naps during the day. Also states he has been told he snores, has never undergone sleep study. He is agreeable to sleep study evaluation, referral sent. Recommended he try melatonin to assist with falling asleep, also discussed sleep hygiene.   Plans    1. Lasix 80 mg IV push x3 doses, Lasix infusion @ 80 mg/hr x3 hrs  2. BMP, LFTS, thyroid TSH  3. Goal:  2 liters net output.   4. Goal [employer]S PAD - 25or less prior to infusion dc and then will gradually work towards goal of 20 or less as outpatient.   5. Potassium 40 mEq oral today  6. Magnesium 400 mg oral today   7. Ongoing dietary counseling  8. Ongoing pharmacy counseling  9. ICD check      Discharge Recommendations:    1. Continue bumex 2mg  BID  2. Continue taking Kdur 10 mEq po bid   3. Continue holding lisinopril   4. Continue spironolactone 25mg  qd  5. Daily weight monitoring.  Bring weight log to follow-up appointment.  6. 2000mg  sodium dietary restriction.  Bring dietary log to follow-up appointment.  7. Return to HF Infusion Clinic on 06/09/17  8. Call [employer] remote to discuss pillow replacement    I had a long discussion with [name] today about his concerns, condition, and treatment options. All questions from [name] were answered in detail to the extent that [name] understands and agrees with the medical plan. Total visit of 45 minutes with [name] of which 35 minutes were dedicated to advanced HF counseling and care coordination.     Objective:      Visit Diagnosis:     Encounter Diagnoses   Name Primary?   ??? Acute on chronic systolic (congestive) heart failure (HCC) Yes         Medications    Current Outpatient Prescriptions on File Prior to Visit   Medication Sig Dispense Refill   ??? amiodarone (CORDARONE) 200 mg tablet Take one tablet by mouth daily. Take with food. 90 tablet 3   ??? apixaban (ELIQUIS) 5 mg tablet Take 5 mg by mouth twice daily.     ??? aspirin EC 81 mg tablet  Take 81 mg by mouth daily. Take with food.      ??? Blood Pressure Monitor (BLOOD PRESSURE KIT) kit Use as directed 1 Kit 0   ??? bumetanide (BUMEX) 2 mg tablet Take one tablet by mouth twice daily. 60 tablet 5   ??? buPROPion SR(+) (WELLBUTRIN, ZYBAN) 200 mg tablet Take 200 mg by mouth daily.     ??? carvedilol (COREG) 3.125 mg tablet Take 3.125 mg by mouth twice daily with meals. Take with food.      ??? HYDROcodone/acetaminophen(+) (NORCO) 10/325 mg tablet Take 1 tablet by mouth every 6 hours as needed for Pain     ??? insulin glargine (TOUJEO SOLOSTAR) 300 unit/mL (1.5 mL) injectable Inject 35 Units under the skin at bedtime daily.     ??? nitroglycerin (NITROSTAT) 0.4 mg tablet Place 0.4 mg under tongue every 5 minutes as needed for Chest Pain. Max of 3 tablets, call 911.      ??? NOVOLOG FLEXPEN U-100 INSULIN 100 unit/mL injection PEN Inject 15 Units under the skin three times daily with meals.  7   ??? omeprazole DR(+) (PRILOSEC) 40 mg capsule Take 40 mg by mouth daily before breakfast.     ??? potassium chloride SR (K-DUR) 10 mEq tablet Take 10 mEq by mouth twice daily. Take with a meal and a full glass of water. ??? pregabalin (LYRICA) 150 mg capsule Take 150 mg by mouth three times daily.     ??? simvastatin (ZOCOR) 20 mg tablet Take 20 mg by mouth at bedtime daily.     ??? spironolactone (ALDACTONE) 25 mg tablet Take one tablet by mouth daily. Take with food. 90 tablet 3     No current facility-administered medications on file prior to visit.            Vitals       There were no vitals filed for this visit.    Wt Readings from Last 20 Encounters:   06/04/17 135.1 kg (297 lb 12.8 oz)   06/04/17 133.5 kg (294 lb 6.4 oz)   06/03/17 135.8 kg (299 lb 6.4 oz)   06/03/17 134.4 kg (296 lb 3.2 oz)   06/01/17 133.7 kg (294 lb 12.8 oz)   06/01/17 135.3 kg (298 lb 3.2 oz)   05/28/17 (!) 137.2 kg (302 lb 6.4 oz)   05/28/17 (!) 137.2 kg (302 lb 6.4 oz)   05/27/17 (!) 139.3 kg (307 lb)   05/27/17 (!) 137.2 kg (302 lb 6.4 oz)   05/25/17 (!) 140.6 kg (310 lb)   05/25/17 (!) 139.7 kg (308 lb)   03/03/17 (!) 137.4 kg (303 lb)   08/06/16 (!) 136.5 kg (301 lb)   12/04/15 120.7 kg (266 lb 1.6 oz)   11/20/15 122.7 kg (270 lb 6.4 oz)   11/20/15 122.7 kg (270 lb 6.4 oz)   11/08/15 122.6 kg (270 lb 3.2 oz)   08/06/15 125.5 kg (276 lb 9.6 oz)   07/08/15 122.5 kg (270 lb)            Last Echo Result:    Echo Results  (Last 3 results in the past 3 years)    Echo EF LVIDD LA Size IVS LVPW Rest PAP    (05/25/17)  25 (05/25/17)  6.81 (05/25/17)  5.79 (05/25/17)  1.32 (05/25/17)  1.22 (05/25/17)  41    (11/20/15)  20 (11/20/15)  7.6 (11/20/15)  4.4 (11/20/15)  0.9 (11/20/15)  0.9 (11/20/15)  24  Physical Exam:    General Appearance: no acute distress  Skin: warm and dry  Digits and Nails: normal color, smooth symmetric nails and digits  Eyes: conjunctivae and lids normal  Neck Veins: JVP 8-10 cm, slight HJR  Chest Inspection: L chest ICD pocket healed  Auscultation: breathing comfortably, lungs clear to auscultation, no rales or rhonchi, no wheezing  Cardiac Auscultation: Regular rhythm, S1, S2, no S3 or S4, no murmur Radial Arteries: normal symmetric radial pulses  Pedal Pulses: normal symmetric pedal pulses  Lower Extremity Edema: 1-33mm bilat edema  Abdominal Exam: soft, round, obese, non-tender, bowel sounds normal  Gait & Station: gait stable, uses cane  Muscle Strength: normal strength and tone  Orientation: clear historian, good insight           Laboratory Review:     Most Recent Point of Care Results:     Sodium-POC   Date Value Ref Range Status   06/08/2017 137 137 - 147 MMOL/L Final     Potassium-POC   Date Value Ref Range Status   06/08/2017 4.0 3.5 - 5.1 MMOL/L Final     Chloride, POC   Date Value Ref Range Status   06/08/2017 97 (L) 98 - 110 MMOL/L Final     CO2, POC   Date Value Ref Range Status   06/08/2017 29 21 - 30 MMOL/L Final     Anion Gap, POC   Date Value Ref Range Status   06/08/2017 16 (H) 3 - 12 Final     Bun, POC   Date Value Ref Range Status   06/08/2017 34 (H) 7 - 25 MG/DL Final     Ionized Calcium-POC   Date Value Ref Range Status   06/08/2017 1.09 1.0 - 1.3 MMOL/L Final     Glucose, POC   Date Value Ref Range Status   06/08/2017 257 (H) 70 - 100 MG/DL Final     BNP POC   Date Value Ref Range Status   05/27/2017 430.0 (H) 0 - 100 PG/ML Final           Walker Shadow, APRN-C  Pager: [phone]

## 2017-06-08 NOTE — Progress Notes
Summary for Heart Failure Infusion Clinic   Today's Date: 06/08/2017               Todays Goal: 2 L      Today you diuresed a total of 1100 ml     Total dose of IV Push: 240mg  of Lasix   400 mg of Magnesium   40 mEq of Potassium      IV Infusion dose was: 80mg /hr x 3 hrs     Pre-infusion weight was: 294.4 lbs     Post-Infusion weight was: 292.4 lbs     Total Net Output:-931ml       Post Infusion Vitals for today:                Blood Pressure:104/75              Heart Rate:68              Oxygen Saturation:98%

## 2017-06-09 ENCOUNTER — Encounter: Admit: 2017-06-09 | Discharge: 2017-06-09 | Payer: MEDICAID

## 2017-06-09 ENCOUNTER — Encounter: Admit: 2017-06-09 | Discharge: 2017-06-09 | Payer: Medicaid Other

## 2017-06-09 ENCOUNTER — Ambulatory Visit: Admit: 2017-06-09 | Discharge: 2017-06-09 | Payer: Medicaid Other

## 2017-06-09 ENCOUNTER — Ambulatory Visit: Admit: 2017-06-09 | Discharge: 2017-06-10 | Payer: MEDICAID

## 2017-06-09 DIAGNOSIS — I739 Peripheral vascular disease, unspecified: ICD-10-CM

## 2017-06-09 DIAGNOSIS — E119 Type 2 diabetes mellitus without complications: ICD-10-CM

## 2017-06-09 DIAGNOSIS — I429 Cardiomyopathy, unspecified: Principal | ICD-10-CM

## 2017-06-09 DIAGNOSIS — I509 Heart failure, unspecified: ICD-10-CM

## 2017-06-09 LAB — POC BASIC METABOLIC PANEL (BMP)
Lab: 1 MMOL/L (ref 1.0–1.3)
Lab: 16 — ABNORMAL HIGH (ref 3–12)
Lab: 197 mg/dL — ABNORMAL HIGH (ref 70–100)
Lab: 2 mg/dL — ABNORMAL HIGH (ref 0.4–1.24)
Lab: 30 MMOL/L (ref 21–30)
Lab: 34 mg/dL — ABNORMAL HIGH (ref 7–25)
Lab: 4.1 MMOL/L (ref 3.5–5.1)
Lab: 97 MMOL/L — ABNORMAL LOW (ref 98–110)

## 2017-06-09 MED ORDER — FUROSEMIDE 10 MG/ML IJ SOLN
80 mg | INTRAVENOUS | 0 refills | Status: CP
Start: 2017-06-09 — End: ?
  Administered 2017-06-09 (×3): 80 mg via INTRAVENOUS

## 2017-06-09 MED ORDER — FUROSEMIDE IV DRIP SYR (MAX CONC)
40-80 mg/h | INTRAVENOUS | 0 refills | Status: DC
Start: 2017-06-09 — End: 2017-06-09
  Administered 2017-06-09: 15:00:00 80 mg/h via INTRAVENOUS

## 2017-06-09 MED ORDER — MAGNESIUM OXIDE 400 MG PO TAB
400 mg | Freq: Once | ORAL | 0 refills | Status: CN
Start: 2017-06-09 — End: ?

## 2017-06-09 MED ORDER — FUROSEMIDE 10 MG/ML IJ SOLN
80 mg | INTRAVENOUS | 0 refills | Status: CN
Start: 2017-06-09 — End: ?

## 2017-06-09 MED ORDER — CHLOROTHIAZIDE SODIUM 500 MG IV SOLR
250-500 mg | INTRAVENOUS | 0 refills | Status: DC | PRN
Start: 2017-06-09 — End: 2017-06-09

## 2017-06-09 MED ORDER — POTASSIUM CHLORIDE 10 MEQ PO TBTQ
10-60 meq | ORAL | 0 refills | Status: CN | PRN
Start: 2017-06-09 — End: ?

## 2017-06-09 MED ORDER — CHLOROTHIAZIDE SODIUM 500 MG IV SOLR
250-500 mg | INTRAVENOUS | 0 refills | Status: CN | PRN
Start: 2017-06-09 — End: ?

## 2017-06-09 MED ORDER — POTASSIUM CHLORIDE 10 MEQ PO TBTQ
10-60 meq | ORAL | 0 refills | Status: DC | PRN
Start: 2017-06-09 — End: 2017-06-09
  Administered 2017-06-09: 15:00:00 30 meq via ORAL

## 2017-06-09 MED ORDER — FUROSEMIDE IV DRIP SYR (MAX CONC)
40-80 mg/h | INTRAVENOUS | 0 refills | Status: CN
Start: 2017-06-09 — End: ?

## 2017-06-09 MED ORDER — MAGNESIUM OXIDE 400 MG PO TAB
400 mg | Freq: Once | ORAL | 0 refills | Status: CP
Start: 2017-06-09 — End: ?
  Administered 2017-06-09: 15:00:00 400 mg via ORAL

## 2017-06-09 NOTE — Progress Notes
Cardiology Infusion Progress Note    Today's Date:  06/09/2017  Name: Larry Pena     MRN: [id number]     HPI:     Larry Pena is a 56 y.o. male.   Patient presents today for seventh infusion for symptomatic treatment of decompensated heart failure symptoms. Patient has never been in the Infusion clinic before.     Pt was referred to HF Infusion Clinic by Dr B.Chales Abrahams after being seen in HF clinic on 05/25/17 and was found to have ~20 lbs excess fluid on board.    05/25/17 (Dr Chales Abrahams clinic visit) Initial weight: 308 lbs  Last infusion: 9/4: 294 lbs    Today:   Wt Readings from Last 1 Encounters:   06/09/17 132.1 kg (291 lb 3.2 oz)       Today he reports fatigue, abdominal fullness and peripheral edema; his cough is improving.     He denies shortness of breath, paroxysmal nocturnal dyspnea, orthopnea, near syncope, palpitations, chest pain and muscle cramping.  He reports taking medication as instructed.  He reports making changes in amiodarone, spironolactone and bumex as directed last Friday.     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  Diuretic is bumex 2mg  PO BID starting last Saturday 9/1. He doesn't feel he has had as much UO on bumex as prior furosemide but his home weight decreased from 291 to 288 over the weekend so for now will continue same dose- suspect he may need escalation to 3mg  BID over this weekend.  He is s/p CardioMems PA sensor device however has not been using it to transmit PA readings. His PAD reading today is 30-32, down from upper 30-low 40s end of last week. The [employer] team has left him a message at home to discuss pillow replacement. It will be up to patient to work out those details. Discussed it would be very helpful in future to have daily readings to help US guide his diuretic regimen.   2. Non-ischemic cardiomyopathy, on suboptimal neurohormonal antagonist (NHA). Previously taking carvedilol 3.125 mg bid, lisinopril 2.5 mg daily, spironolactone 25 mg daily. On 8/24 he was instructed to hold his lisinopril and spironolactone due to elevated renal function. We restarted spironolactone 25mg  daily on 9/1. He recently had Echo completed on 8/21 with LVEF 25%, LV 6.8cm and mild-mod AI with moderate dilation of ascending aorta. Prior echo in 11/2015 showed EF 15-20% with grade I diastolic dysfunction.   3. 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 Creatinine today is 2.0. Recent range has been 1.5-1.9. With elevated PAD will infuse today per protocol (see below). Instructed to continue holding his lisinopril while we are trying to get him diuresed.   4. Coronary Artery Disease surveillance. LHC in 2016 was negative for obstructive CAD. No current symptoms of ischemia.Will continue on his Asa 81 mg daily and simvastatin 20 mg qhs.   5. Hypertension. Blood pressure: 114/66. Currently taking carvedilol 3.125 mg po bid.  6. CRT-D. Recently upgraded to CRT-D summer of 2017 per [facility]. Interrogation today reveals 99.8% bi-v pacing, no atrial or ventricular arrhythmias and improvement in thoracic impedence in past few days.    7.  Atrial arrhythmias. Continues chronic anticoagulation with eliquis 5 mg bid. Appears he may have been on amiodarone 200mg  BID since his initial episode of afib and cardioversion at [facility] back in 2017. Reduced amiodarone to 200mg  daily on 8/31 and LFTs, Thyroid studies today are normal. Will need PFT or CXR in near future for baseline monitoring since he wants to continue care here at [facility]   8. Chronic low back pain. He is planning to undergo spinal injection in upcoming month with Dr. Bernette Mayers in [place], [place] (phone # [phone], fax # [phone]); Dr. Chales Abrahams [facility] RN) has faxed a letter to Dr Bernette Mayers stating it's ok for him to be off of his eliquis for 5-7 days prior to having spinal injection.   9. DM 2. Currently taking Toujeo 40 units subq QHS, Novolog 15 units subq tid with meals. Last HbgA1c 10.0 in 11/2015; he reports he has been getting most of his care at [facility] in [place], [place]. No recent notes in Care Everywhere regarding his diabetes. Today he indicates he would like to get established with Endocrinology at [facility]. Referral sent to [facility] to have him establish for further management.   10. Daytime somnolence. He reports having difficulty sleeping at night, states he often requires naps during the day. Also states he has been told he snores, has never undergone sleep study. He is agreeable to sleep study evaluation, referral sent. Recommended he try melatonin to assist with falling asleep, also discussed sleep hygiene.   Plans    1. Lasix 80 mg IV push x3 doses, Lasix infusion @ 80 mg/hr x3 hrs  2. BMP  3. Goal:  2 liters net output.   4. Goal CardioMEMS PAD - 25or less prior to infusion dc and then will gradually work towards goal of 20 or less as outpatient.   5. Potassium 30 mEq oral today  6. Magnesium 400 mg oral today   7. Ongoing dietary counseling  8. Ongoing pharmacy counseling        Discharge Recommendations:    1. Continue bumex 2mg  BID  2. Continue taking Kdur 10 mEq po bid   3. Continue holding lisinopril   4. Continue spironolactone 25mg  qd  5. Daily weight monitoring.  Bring weight log to follow-up appointment.  6. 2000mg  sodium dietary restriction.  Bring dietary log to follow-up appointment.  7. Return to HF Infusion Clinic on 06/10/17  8. Call [employer] remote to discuss pillow replacement    I had a long discussion with Larry Pena today about his concerns, condition, and treatment options. All questions from Larry Pena were answered in detail to the extent that Larry Pena understands and agrees with the medical plan. Total visit of 40 minutes with Larry Pena to advanced HF counseling and care coordination.     Objective:      Visit Diagnosis:     Encounter Diagnoses   Name Primary?   ??? Acute on chronic systolic (congestive) heart failure (HCC) Yes         Medications    Current Outpatient Prescriptions on File Prior to Visit   Medication Sig Dispense Refill   ??? amiodarone (CORDARONE) 200 mg tablet Take one tablet by mouth daily. Take with food. 90 tablet 3   ??? apixaban (ELIQUIS) 5 mg tablet Take 5 mg by mouth twice daily.     ??? aspirin EC 81 mg tablet Take 81 mg by mouth daily.  Take with food.      ??? Blood Pressure Monitor (BLOOD PRESSURE KIT) kit Use as directed 1 Kit 0   ??? bumetanide (BUMEX) 1 mg tablet Take two tablets by mouth twice daily.     ??? buPROPion SR(+) (WELLBUTRIN, ZYBAN) 200 mg tablet Take 200 mg by mouth daily.     ??? carvedilol (COREG) 3.125 mg tablet Take 3.125 mg by mouth twice daily with meals. Take with food.      ??? HYDROcodone/acetaminophen(+) (NORCO) 10/325 mg tablet Take 1 tablet by mouth every 6 hours as needed for Pain     ??? insulin glargine (TOUJEO SOLOSTAR) 300 unit/mL (1.5 mL) injectable Inject 35 Units under the skin at bedtime daily.     ??? nitroglycerin (NITROSTAT) 0.4 mg tablet Place 0.4 mg under tongue every 5 minutes as needed for Chest Pain. Max of 3 tablets, call 911.      ??? NOVOLOG FLEXPEN U-100 INSULIN 100 unit/mL injection PEN Inject 15 Units under the skin three times daily with meals.  7   ??? omeprazole DR(+) (PRILOSEC) 40 mg capsule Take 40 mg by mouth daily before breakfast.     ??? potassium chloride SR (K-DUR) 10 mEq tablet Take 10 mEq by mouth twice daily. Take with a meal and a full glass of water. ??? pregabalin (LYRICA) 150 mg capsule Take 150 mg by mouth three times daily.     ??? simvastatin (ZOCOR) 20 mg tablet Take 20 mg by mouth at bedtime daily.     ??? spironolactone (ALDACTONE) 25 mg tablet Take one tablet by mouth daily. Take with food. 90 tablet 3     Current Facility-Administered Medications on File Prior to Visit   Medication Dose Route Frequency Provider Last Rate Last Dose   ??? chlorothiazide injection 250-500 mg  250-500 mg Intravenous PRN Ivory Broad, APRN       ??? furosemide (LASIX) 500 mg in empty IV bag 50 mL IV drip syr (max conc)  40-80 mg/hr Intravenous Continuous Ivory Broad, APRN 8 mL/hr at 06/09/17 1020 80 mg/hr at 06/09/17 1020   ??? furosemide (LASIX) injection 80 mg  80 mg Intravenous Q1H X Pearlean Brownie, APRN-C   80 mg at 06/09/17 1020   ??? potassium chloride SR (K-DUR) tablet 10-60 mEq  10-60 mEq Oral PRN Ivory Broad, APRN   30 mEq at 06/09/17 1021   ??? WATER FOR INJECTION, STERILE IJ SOLN (Cabinet Override)    NOW                Vitals       Vitals:    06/09/17 1017   BP: 114/66   Pulse: 70   Resp: 20   SpO2: 96%       Wt Readings from Last 20 Encounters:   06/09/17 132.1 kg (291 lb 3.2 oz)   06/09/17 132.1 kg (291 lb 3.2 oz)   06/08/17 132.6 kg (292 lb 6.4 oz)   06/08/17 133.5 kg (294 lb 6.4 oz)   06/04/17 135.1 kg (297 lb 12.8 oz)   06/04/17 133.5 kg (294 lb 6.4 oz)   06/03/17 135.8 kg (299 lb 6.4 oz)   06/03/17 134.4 kg (296 lb 3.2 oz)   06/01/17 133.7 kg (294 lb 12.8 oz)   06/01/17 135.3 kg (298 lb 3.2 oz)   05/28/17 (!) 137.2 kg (302 lb 6.4 oz)   05/28/17 (!) 137.2 kg (302 lb 6.4 oz)   05/27/17 (!) 139.3 kg (307 lb)   05/27/17 Marland Kitchen)  137.2 kg (302 lb 6.4 oz)   05/25/17 (!) 140.6 kg (310 lb)   05/25/17 (!) 139.7 kg (308 lb)   03/03/17 (!) 137.4 kg (303 lb)   08/06/16 (!) 136.5 kg (301 lb)   12/04/15 120.7 kg (266 lb 1.6 oz)   11/20/15 122.7 kg (270 lb 6.4 oz)            Last Echo Result:    Echo Results  (Last 3 results in the past 3 years) Echo EF LVIDD LA Size IVS LVPW Rest PAP    (05/25/17)  25 (05/25/17)  6.81 (05/25/17)  5.79 (05/25/17)  1.32 (05/25/17)  1.22 (05/25/17)  41    (11/20/15)  20 (11/20/15)  7.6 (11/20/15)  4.4 (11/20/15)  0.9 (11/20/15)  0.9 (11/20/15)  24                  Physical Exam:    General Appearance: no acute distress  Skin: warm and dry  Digits and Nails: normal color, smooth symmetric nails and digits  Eyes: conjunctivae and lids normal  Neck Veins: JVP 8-10 cm, slight HJR  Chest Inspection: L chest ICD pocket healed  Auscultation: breathing comfortably, lungs clear to auscultation, no rales or rhonchi, no wheezing  Cardiac Auscultation: Regular rhythm, S1, S2, no S3 or S4, no murmur  Radial Arteries: normal symmetric radial pulses  Pedal Pulses: normal symmetric pedal pulses  Lower Extremity Edema: 1-72mm bilat edema  Abdominal Exam: soft, round, obese, non-tender, bowel sounds normal  Gait & Station: gait stable, uses cane  Muscle Strength: normal strength and tone  Orientation: clear historian, good insight           Laboratory Review:     Most Recent Point of Care Results:     Sodium-POC   Date Value Ref Range Status   06/09/2017 138 137 - 147 MMOL/L Final     Potassium-POC   Date Value Ref Range Status   06/09/2017 4.1 3.5 - 5.1 MMOL/L Final     Chloride, POC   Date Value Ref Range Status   06/09/2017 97 (L) 98 - 110 MMOL/L Final     CO2, POC   Date Value Ref Range Status   06/09/2017 30 21 - 30 MMOL/L Final     Anion Gap, POC   Date Value Ref Range Status   06/09/2017 16 (H) 3 - 12 Final     Bun, POC   Date Value Ref Range Status   06/09/2017 34 (H) 7 - 25 MG/DL Final     Ionized Calcium-POC   Date Value Ref Range Status   06/09/2017 1.05 1.0 - 1.3 MMOL/L Final     Glucose, POC   Date Value Ref Range Status   06/09/2017 197 (H) 70 - 100 MG/DL Final     BNP POC   Date Value Ref Range Status   05/27/2017 430.0 (H) 0 - 100 PG/ML Final           Walker Shadow, APRN-C  Pager: [phone]

## 2017-06-09 NOTE — Progress Notes
Summary for Heart Failure Infusion Clinic   Today's Date: 06/09/2017               Todays Goal: 2 L      Today you diuresed a total of 925 ml     Total dose of IV Push: 240 mg of lasix   400 mg of Magnesium   30 mEq of Potassium      IV Infusion dose was: 80 mg/hr x 3 hrs     Pre-infusion weight was: 291.2 lbs     Post-Infusion weight was: 289.2  lbs     Total Net Output:-805 ml       Post Infusion Vitals for today:                Blood Pressure:98/61              Heart Rate:70              Oxygen Saturation:97%

## 2017-06-09 NOTE — Progress Notes
Patient has only had 375 ml UOP. Patient has been infusion for 2 hours and is due for his 3rd lasix bolus. Harlene Salts, APRN notified. No new orders at this time.

## 2017-06-10 ENCOUNTER — Encounter: Admit: 2017-06-10 | Discharge: 2017-06-10 | Payer: MEDICAID

## 2017-06-10 ENCOUNTER — Ambulatory Visit: Admit: 2017-06-10 | Discharge: 2017-06-10 | Payer: MEDICAID

## 2017-06-10 ENCOUNTER — Ambulatory Visit: Admit: 2017-06-10 | Discharge: 2017-06-11 | Payer: Medicaid Other

## 2017-06-10 DIAGNOSIS — E119 Type 2 diabetes mellitus without complications: ICD-10-CM

## 2017-06-10 DIAGNOSIS — Z79899 Other long term (current) drug therapy: ICD-10-CM

## 2017-06-10 DIAGNOSIS — G8929 Other chronic pain: ICD-10-CM

## 2017-06-10 DIAGNOSIS — I5023 Acute on chronic systolic (congestive) heart failure: Principal | ICD-10-CM

## 2017-06-10 DIAGNOSIS — I429 Cardiomyopathy, unspecified: ICD-10-CM

## 2017-06-10 DIAGNOSIS — I5022 Chronic systolic (congestive) heart failure: Principal | ICD-10-CM

## 2017-06-10 DIAGNOSIS — Z794 Long term (current) use of insulin: ICD-10-CM

## 2017-06-10 DIAGNOSIS — Z5181 Encounter for therapeutic drug level monitoring: ICD-10-CM

## 2017-06-10 DIAGNOSIS — E1159 Type 2 diabetes mellitus with other circulatory complications: ICD-10-CM

## 2017-06-10 DIAGNOSIS — N183 Chronic kidney disease, stage 3 (moderate): ICD-10-CM

## 2017-06-10 LAB — POC BASIC METABOLIC PANEL (BMP)
Lab: 1 MMOL/L (ref 1.0–1.3)
Lab: 137 MMOL/L (ref 137–147)
Lab: 16 — ABNORMAL HIGH (ref 3–12)
Lab: 173 mg/dL — ABNORMAL HIGH (ref 70–100)
Lab: 2.2 mg/dL — ABNORMAL HIGH (ref 0.4–1.24)
Lab: 30 MMOL/L (ref 21–30)
Lab: 37 mg/dL — ABNORMAL HIGH (ref 7–25)
Lab: 4 MMOL/L (ref 3.5–5.1)
Lab: 96 MMOL/L — ABNORMAL LOW (ref 98–110)

## 2017-06-10 MED ORDER — MAGNESIUM OXIDE 400 MG PO TAB
400 mg | Freq: Once | ORAL | 0 refills | Status: CP
Start: 2017-06-10 — End: ?
  Administered 2017-06-10: 16:00:00 400 mg via ORAL

## 2017-06-10 MED ORDER — FUROSEMIDE 10 MG/ML IJ SOLN
80 mg | INTRAVENOUS | 0 refills | Status: CN
Start: 2017-06-10 — End: ?

## 2017-06-10 MED ORDER — FUROSEMIDE 10 MG/ML IJ SOLN
80 mg | INTRAVENOUS | 0 refills | Status: DC
Start: 2017-06-10 — End: 2017-06-10
  Administered 2017-06-10 (×2): 80 mg via INTRAVENOUS

## 2017-06-10 MED ORDER — FUROSEMIDE IV DRIP SYR (MAX CONC)
40-80 mg/h | INTRAVENOUS | 0 refills | Status: CN
Start: 2017-06-10 — End: ?

## 2017-06-10 MED ORDER — CHLOROTHIAZIDE SODIUM 500 MG IV SOLR
250-500 mg | INTRAVENOUS | 0 refills | Status: CN | PRN
Start: 2017-06-10 — End: ?

## 2017-06-10 MED ORDER — MAGNESIUM OXIDE 400 MG PO TAB
400 mg | Freq: Once | ORAL | 0 refills | Status: CN
Start: 2017-06-10 — End: ?

## 2017-06-10 MED ORDER — POTASSIUM CHLORIDE 10 MEQ PO TBTQ
10-60 meq | ORAL | 0 refills | Status: CN | PRN
Start: 2017-06-10 — End: ?

## 2017-06-10 MED ORDER — POTASSIUM CHLORIDE 10 MEQ PO TBTQ
10-60 meq | ORAL | 0 refills | Status: DC | PRN
Start: 2017-06-10 — End: 2017-06-10
  Administered 2017-06-10: 16:00:00 20 meq via ORAL

## 2017-06-10 MED ORDER — FUROSEMIDE IV DRIP SYR (MAX CONC)
40-80 mg/h | INTRAVENOUS | 0 refills | Status: DC
Start: 2017-06-10 — End: 2017-06-10
  Administered 2017-06-10: 16:00:00 80 mg/h via INTRAVENOUS

## 2017-06-10 NOTE — Progress Notes
Per Harlene Salts, APRN, patient is only to infuse for two hours with two boluses today.

## 2017-06-10 NOTE — Progress Notes
Cardiology Infusion Progress Note    Today's Date:  06/10/2017  Name: Larry Pena     MRN: [id number]     HPI:     Larry Pena is a 56 y.o. male.   Patient presents today for eighth infusion for symptomatic treatment of decompensated heart failure symptoms. Patient has never been in the Infusion clinic before.     Pt was referred to HF Infusion Clinic by Dr B.Chales Abrahams after being seen in HF clinic on 05/25/17 and was found to have ~20 lbs excess fluid on board.    05/25/17 (Dr Chales Abrahams clinic visit) Initial weight: 308 lbs  Last infusion: 9/5: 291.2 lbs    Today:   Wt Readings from Last 1 Encounters:   06/10/17 131.4 kg (289 lb 9.6 oz)       Today he reports overall symptoms have been improving. He reports mild abdominal fullness.    He denies shortness of breath, paroxysmal nocturnal dyspnea, orthopnea, near syncope, palpitations, chest pain and muscle cramping.    He reports taking medication as instructed.  He reports making changes in amiodarone, spironolactone and bumex as directed last Friday.     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 With slight rise in creatinine will only run infusion protocol for 2 hours today. PAD remains 28-30 (but down from upper 30-40 last week). Home diuretic was changed to bumex 2mg  PO BID starting last Saturday 9/1. He doesn't feel he has had as much UO on bumex as prior furosemide but his home weight decreased from 291 to 288 over the weekend so for now will continue same dose- suspect he may need escalation to 3mg  BID if unable to maintain discharge weight.   The [employer] team has left him a message at home to discuss pillow replacement. It will be up to patient to work out those details. Discussed it would be very helpful in future to have daily readings to help US guide his diuretic regimen. 2. Non-ischemic cardiomyopathy, on suboptimal neurohormonal antagonist (NHA). Previously taking carvedilol 3.125 mg bid, lisinopril 2.5 mg daily, spironolactone 25 mg daily. On 8/24 he was instructed to hold his lisinopril and spironolactone due to elevated renal function. We restarted spironolactone 25mg  daily on 9/1. He recently had Echo completed on 8/21 with LVEF 25%, LV 6.8cm and mild-mod AI with moderate dilation of ascending aorta. Prior echo in 11/2015 showed EF 15-20% with grade I diastolic dysfunction.   3. 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 Creatinine today is 2.2. Recent range has been 1.5-2.0. With elevated PAD will infuse today for shorter run. Instructed to continue holding his lisinopril while we are trying to get him diuresed. Hopefully can resume in outpt clinic and if not, will want to use hydralazine/nitrates for vasodilator therapy.  4. Coronary Artery Disease surveillance. LHC in 2016 was negative for obstructive CAD. No current symptoms of ischemia.Will continue on his Asa 81 mg daily and simvastatin 20 mg qhs.   5. Hypertension. Blood pressure: 108/59. Currently taking carvedilol 3.125 mg po bid.  6. CRT-D. Recently upgraded to CRT-D summer of 2017 per [facility] cardiology at [facility]. Interrogation on 9/4 reveals 99.8% bi-v pacing, no atrial or ventricular arrhythmias and improvement in thoracic impedence in past few days.    7. Atrial arrhythmias.  Continues chronic anticoagulation with eliquis 5 mg bid. Appears he may have been on amiodarone 200mg  BID since his initial episode of afib and cardioversion at [facility] hospital back in 2017. Reduced amiodarone to 200mg  daily on 8/31 and LFTs, Thyroid studies today are normal. Will need PFT  in near future for baseline monitoring since he wants to continue care here at [facility]. He will have CXR done today after infusion.  8. Chronic low back pain. He is planning to undergo spinal injection in upcoming month with Dr. Bernette Mayers in [name], [place] (phone # [phone], fax # [phone]); [facility]ams [facility] has faxed a letter to Dr Bernette Mayers stating it's ok for him to be off of his eliquis for 5-7 days prior to having spinal injection.   9. DM 2. Currently taking Toujeo 40 units subq QHS, Novolog 15 units subq tid with meals. Last HbgA1c 10.0 in 11/2015; he reports he has been getting most of his care at [facility] in [place], [place]. No recent notes in Care Everywhere regarding his diabetes. Today he indicates he would like to get established with Endocrinology at [facility]. Referral sent to [facility] to have him establish for further management.   10. Daytime somnolence. He reports having difficulty sleeping at night, states he often requires naps during the day. Also states he has been told he snores, has never undergone sleep study. He is agreeable to sleep study evaluation, referral sent. Recommended he try melatonin to assist with falling asleep, also discussed sleep hygiene.   Plans    1. Lasix 80 mg IV push x2 doses, Lasix infusion @ 80 mg/hr x2 hrs  2. BMP  3. Goal:  2 liters net output.   4. Goal CardioMEMS PAD - 25or less prior to infusion dc and then will gradually work towards goal of 20 or less as outpatient.   5. Potassium 20 mEq oral today  6. Magnesium 400 mg oral today   7. Ongoing dietary counseling  8. Ongoing pharmacy counseling        Discharge Recommendations:    1. Continue bumex 2mg  BID  2. Continue taking Kdur 10 mEq po bid   3. Continue holding lisinopril   4. Continue spironolactone 25mg  qd  5. Daily weight monitoring.  Bring weight log to follow-up appointment.  6. 2000mg  sodium dietary restriction.  Bring dietary log to follow-up appointment.  7. Return to [facility] outpatient clinic on 9/17 with Walker Shadow APRN  8. Call CardioMEMS remote to discuss pillow replacement    I had a long discussion with [name] today about his concerns, condition, and treatment options. All questions from [name] [facility] were answered in detail to the extent that [name] understands and agrees with the medical plan. Total visit of 40 minutes with [name] [facility] of which 30 minutes were dedicated to advanced HF counseling and care coordination.     Objective:      Visit Diagnosis:     Encounter Diagnoses   Name Primary?   ??? Chronic systolic heart failure (HCC) Yes   ??? Chronic kidney disease, stage 3 (moderate) (HCC)          Medications    Current Outpatient Prescriptions on File Prior to Visit   Medication Sig Dispense Refill   ??? amiodarone (CORDARONE) 200 mg tablet Take one tablet by mouth daily. Take with food. 90 tablet 3   ??? apixaban (ELIQUIS) 5 mg tablet Take 5 mg by mouth twice  daily.     ??? aspirin EC 81 mg tablet Take 81 mg by mouth daily. Take with food.      ??? Blood Pressure Monitor (BLOOD PRESSURE KIT) kit Use as directed 1 Kit 0   ??? bumetanide (BUMEX) 1 mg tablet Take two tablets by mouth twice daily.     ??? buPROPion SR(+) (WELLBUTRIN, ZYBAN) 200 mg tablet Take 200 mg by mouth daily.     ??? carvedilol (COREG) 3.125 mg tablet Take 3.125 mg by mouth twice daily with meals. Take with food.      ??? HYDROcodone/acetaminophen(+) (NORCO) 10/325 mg tablet Take 1 tablet by mouth every 6 hours as needed for Pain     ??? insulin glargine (TOUJEO SOLOSTAR) 300 unit/mL (1.5 mL) injectable Inject 35 Units under the skin at bedtime daily.     ??? nitroglycerin (NITROSTAT) 0.4 mg tablet Place 0.4 mg under tongue every 5 minutes as needed for Chest Pain. Max of 3 tablets, call 911.      ??? NOVOLOG FLEXPEN U-100 INSULIN 100 unit/mL injection PEN Inject 15 Units under the skin three times daily with meals.  7   ??? omeprazole DR(+) (PRILOSEC) 40 mg capsule Take 40 mg by mouth daily before breakfast.     ??? potassium chloride SR (K-DUR) 10 mEq tablet Take 10 mEq by mouth twice daily. Take with a meal and a full glass of water. ??? pregabalin (LYRICA) 150 mg capsule Take 150 mg by mouth three times daily.     ??? simvastatin (ZOCOR) 20 mg tablet Take 20 mg by mouth at bedtime daily.     ??? spironolactone (ALDACTONE) 25 mg tablet Take one tablet by mouth daily. Take with food. 90 tablet 3     No current facility-administered medications on file prior to visit.            Vitals       Vitals:    06/10/17 1005   BP: 108/59   Pulse: 72   Resp: 18   SpO2: 96%       Wt Readings from Last 20 Encounters:   06/10/17 131.4 kg (289 lb 9.6 oz)   06/10/17 131.4 kg (289 lb 9.6 oz)   06/09/17 132.1 kg (291 lb 3.2 oz)   06/09/17 131.2 kg (289 lb 3.2 oz)   06/08/17 132.6 kg (292 lb 6.4 oz)   06/08/17 133.5 kg (294 lb 6.4 oz)   06/04/17 135.1 kg (297 lb 12.8 oz)   06/04/17 133.5 kg (294 lb 6.4 oz)   06/03/17 135.8 kg (299 lb 6.4 oz)   06/03/17 134.4 kg (296 lb 3.2 oz)   06/01/17 133.7 kg (294 lb 12.8 oz)   06/01/17 135.3 kg (298 lb 3.2 oz)   05/28/17 (!) 137.2 kg (302 lb 6.4 oz)   05/28/17 (!) 137.2 kg (302 lb 6.4 oz)   05/27/17 (!) 139.3 kg (307 lb)   05/27/17 (!) 137.2 kg (302 lb 6.4 oz)   05/25/17 (!) 140.6 kg (310 lb)   05/25/17 (!) 139.7 kg (308 lb)   03/03/17 (!) 137.4 kg (303 lb)   08/06/16 (!) 136.5 kg (301 lb)            Last Echo Result:    Echo Results  (Last 3 results in the past 3 years)    Echo EF LVIDD LA Size IVS LVPW Rest PAP    (05/25/17)  25 (05/25/17)  6.81 (05/25/17)  5.79 (05/25/17)  1.32 (05/25/17)  1.22 (05/25/17)  41    (  11/20/15)  20 (11/20/15)  7.6 (11/20/15)  4.4 (11/20/15)  0.9 (11/20/15)  0.9 (11/20/15)  24                  Physical Exam:    General Appearance: no acute distress  Skin: warm and dry  Digits and Nails: normal color, smooth symmetric nails and digits  Eyes: conjunctivae and lids normal  Neck Veins: JVP 8 cm, slight HJR  Chest Inspection: L chest ICD pocket healed  Auscultation: breathing comfortably, lungs clear to auscultation, no rales or rhonchi, no wheezing Cardiac Auscultation: Regular rhythm, S1, S2, no S3 or S4, no murmur  Radial Arteries: normal symmetric radial pulses  Pedal Pulses: normal symmetric pedal pulses  Lower Extremity Edema: 1mm bilat edema  Abdominal Exam: soft, round, obese, non-tender, bowel sounds normal  Gait & Station: gait stable, uses cane  Muscle Strength: normal strength and tone  Orientation: clear historian, good insight           Laboratory Review:     Most Recent Point of Care Results:     Sodium-POC   Date Value Ref Range Status   06/10/2017 137 137 - 147 MMOL/L Final     Potassium-POC   Date Value Ref Range Status   06/10/2017 4.0 3.5 - 5.1 MMOL/L Final     Chloride, POC   Date Value Ref Range Status   06/10/2017 96 (L) 98 - 110 MMOL/L Final     CO2, POC   Date Value Ref Range Status   06/10/2017 30 21 - 30 MMOL/L Final     Anion Gap, POC   Date Value Ref Range Status   06/10/2017 16 (H) 3 - 12 Final     Bun, POC   Date Value Ref Range Status   06/10/2017 37 (H) 7 - 25 MG/DL Final     Ionized Calcium-POC   Date Value Ref Range Status   06/10/2017 1.07 1.0 - 1.3 MMOL/L Final     Glucose, POC   Date Value Ref Range Status   06/10/2017 173 (H) 70 - 100 MG/DL Final     BNP POC   Date Value Ref Range Status   05/27/2017 430.0 (H) 0 - 100 PG/ML Final           Walker Shadow, APRN-C  Pager: [phone]

## 2017-06-10 NOTE — Progress Notes
Amiodarone follow-up letter and Krames amiodarone patient education mailed to patient.

## 2017-06-10 NOTE — Progress Notes
Summary for Heart Failure Infusion Clinic   Today's Date: 06/10/2017               Todays Goal: 2 L      Today you diuresed a total of 650 ml     Total dose of IV Push: 160 mg of lasix   400 mg of Magnesium   20 mEq of Potassium      IV Infusion dose was: 80 mg/hr x 2 hrs     Pre-infusion weight was: 289.6 lbs     Post-Infusion weight was: 288.6 lbs     Total Net Output:-550 ml       Post Infusion Vitals for today:                Blood Pressure:122/64              Heart Rate:75              Oxygen Saturation:96%

## 2017-06-10 NOTE — Progress Notes
Amiodarone Monitoring status as of 06/10/17:     Dose: 200 mg daily. 1400 mg weekly.    Most recent lab results  Lab Results   Component Value Date/Time    AST 30 06/08/2017 10:34 AM    ALT 29 06/08/2017 10:34 AM    TSH 1.320 06/08/2017 10:34 AM         Procedures  Last chest X-Ray: Due now    Last PFT: Due now  Last eye exam: unknown

## 2017-06-11 ENCOUNTER — Encounter: Admit: 2017-06-11 | Discharge: 2017-06-11 | Payer: MEDICAID

## 2017-06-11 DIAGNOSIS — I5023 Acute on chronic systolic (congestive) heart failure: Principal | ICD-10-CM

## 2017-06-11 NOTE — Telephone Encounter
Patient called up to infusion room this afternoon to report he has had less urine output in the past 24 hours.   He also noted some increased lightheadedness last evening and even slight this am but has resolved.     His home weight has been as follows:    9/4 288lbs   PAD: 31 (was 38-42 last week)  9/5 286lbs   PAD: 30-32   9/6 281lbs   PAD: 28-30    9/7 285lbs   No PAD reading as home as pillow not working (above readings obtained in infusion clinic)    He had taken both doses of bumex today. Asked him to hold one dose tomorrow (as creatinine had gradually been climbing upward as we brought pulmonary pressures down so wanted to be a little cautious) and then resume bumex 2mg  bid starting on Sunday.    He will be seeing PCP, Dr. Joellyn Rued, on 9/11 for routine f/u in [place]. I have asked him to get BMP at [facility] after his PCP appointment. Zoila Shutter RN will fax an order to hospital lab on [facility] said he blood sugars have been running higher in the past week (now 170-190s) when typically they always stay around 120-130. Asked him to discuss further with Dr. Isidore Moos next week.     Asked him to call us next week if home weight is consistently rising about 285lbs.   He was also to call [employer] line today to work on getting a new, funcitonal MEMS pillow so we can follow pressures from home.      CR

## 2017-06-14 MED ORDER — FUROSEMIDE 10 MG/ML IJ SOLN
40 mg | INTRAVENOUS | 0 refills | Status: CN
Start: 2017-06-14 — End: ?

## 2017-06-14 MED ORDER — POTASSIUM CHLORIDE 10 MEQ PO TBTQ
10-60 meq | ORAL | 0 refills | Status: CN | PRN
Start: 2017-06-14 — End: ?

## 2017-06-14 MED ORDER — MAGNESIUM OXIDE 400 MG PO TAB
400 mg | Freq: Once | ORAL | 0 refills | Status: CN
Start: 2017-06-14 — End: ?

## 2017-06-14 MED ORDER — CHLOROTHIAZIDE SODIUM 500 MG IV SOLR
250-500 mg | INTRAVENOUS | 0 refills | Status: CN | PRN
Start: 2017-06-14 — End: ?

## 2017-06-14 MED ORDER — FUROSEMIDE IV DRIP SYR (MAX CONC)
40-80 mg/h | INTRAVENOUS | 0 refills | Status: CN
Start: 2017-06-14 — End: ?

## 2017-06-15 ENCOUNTER — Encounter: Admit: 2017-06-15 | Discharge: 2017-06-15 | Payer: Medicaid Other

## 2017-06-15 ENCOUNTER — Encounter: Admit: 2017-06-15 | Discharge: 2017-06-15 | Payer: MEDICAID

## 2017-06-15 NOTE — Telephone Encounter
Patient called this morning to cancel his infusion appointment today due to feeling sick. His weight is down 3 pounds from yesterday. Today his weight is 289 lbs.     Larry Dials, APRN notified. Larry Pena said patient does not need to return to infusion this week, and he can plan to f/u in HF Clinic with Larry Salts, APRN on Monday 9/17, as previously scheduled. Per Larry Pena, will instruct patient to call HF Infusion Clinic if his weight increases later this week and patient can get added on to the schedule for possible infusion on Thursday or Friday.     Patient notified of Larry Dials, APRN's instructions listed above. Patient verbalized understanding and stated he will call if his weight goes back up.     Patient reporting that his CardioMems pillow came in last night but he is worried it only works with a land line which he doesn't have. I told the patient I would contact Larry Pena, CardioMems Coordinator and will ask her to follow up with patient. Patient appreciative of phone call and has no further questions or concerns.

## 2017-06-15 NOTE — Telephone Encounter
Received call from patient stating he wasn't feeling well today and needed to cancel his scheduled appointment with the infusion clinic this morning. Patient also stated he was down three pounds on his weight from yesterday and was wondering if he still needed to come in for an infusion tomorrow. This RN told patient I would speak with NP and call him back for rescheduling if needed.

## 2017-06-18 ENCOUNTER — Encounter: Admit: 2017-06-18 | Discharge: 2017-06-18 | Payer: MEDICAID

## 2017-06-18 NOTE — Telephone Encounter
Received notification from Perry Mount, RN, that patient reported high blood sugar values recently. Called PCP office and spoke to [place], Therapist, sports. She states she will follow up with patient at this time.

## 2017-06-18 NOTE — Telephone Encounter
Patient called HF Infusion Clinic expressing concern that the bumex he was switched to last week is making his blood sugars go up. Today he reports a blood glucose of above 325. Patient states Harlene Salts, APRN told him to keep a close eye on his blood sugars and notify us if they increase with the bumex. Patient states he has called the HF Clinic and they told him to contact his PCP regarding his blood sugars but he did and his PCP is already gone for the weekend.    Patient says he still has plenty of furosemide tablets at home and he wants to switch back to furosemide over the weekend so he can better control his blood sugars. Patient reports his home weight today at 289 lbs, same as what is was on Tuesday 9/11 when he called.     Julien Girt, APRN in HF Infusion today notified of situation. Per Jan, patient's PAD has been consistently higher than 25 the past couple days therefore she would like him to increase his diuretic over the weekend and f/u with Alyse Low in [facility] on Monday morning as previously scheduled. V/O from Jan that patient is OK to switch back to taking furosemide this weekend.    Prior to switching to bumex, patient was taking furosemide 80 mg in AM, 40 mg in PM. Per Jan, instruct patient to:  1. Increase furosemide to 80 mg twice a day starting tomorrow morning, 9/15  2. Increase k-dur to 2meq twice a day (was previously 20 meq daily) starting tomorrow morning, 9/15  3. Make sure he sends CardioMems readings every day this weekend and Monday morning before coming to his appt in HF Clinic     Patient given instructions from Jan listed above. Patient verbalized understanding and was able to verbalize instructions back to me correctly. Patient appreciative of phone call.

## 2017-06-18 NOTE — Telephone Encounter
Pt called reporting that his blood sugars are elevated. Pt had discussed this previously with Larry Salts APRN on 9/7 stating:  "Larry Pena said he blood sugars have been running higher in the past week (now 170-190s) when typically they always stay around 120-130. Asked him to discuss further with Larry Pena next week. "      Referred pt to contact his PCP for diabetes management and pt refused. Pt states that Larry Pena prescribed him bumex and that raised his blood sugar so she will treat it. Informed pt that as a cardiology clinic we simply cannot manage his diabetes/blood sugar and that [facility] had recommended him to follow up with PCP for this issue. Pt again refuses to call PCP and states he will stop taking his bumex and resume his previous medications. Advised pt that he should take his prescribed medications and follow up with PCP, pt demanded he be called back by another nurse and ended call.       Discussed weight and symptoms with pt at beginning of call, pt steady at 289 lbs and no symptoms. Pt states some leg swelling that is pretty usual for him.

## 2017-06-21 ENCOUNTER — Ambulatory Visit: Admit: 2017-06-21 | Discharge: 2017-06-21 | Payer: Medicaid Other

## 2017-06-21 ENCOUNTER — Encounter: Admit: 2017-06-21 | Discharge: 2017-06-21 | Payer: Medicaid Other

## 2017-06-21 ENCOUNTER — Ambulatory Visit: Admit: 2017-06-21 | Discharge: 2017-06-22 | Payer: MEDICAID

## 2017-06-21 ENCOUNTER — Encounter: Admit: 2017-06-21 | Discharge: 2017-06-21 | Payer: MEDICAID

## 2017-06-21 ENCOUNTER — Ambulatory Visit: Admit: 2017-06-21 | Discharge: 2017-06-21 | Payer: MEDICAID

## 2017-06-21 DIAGNOSIS — I5022 Chronic systolic (congestive) heart failure: Principal | ICD-10-CM

## 2017-06-21 DIAGNOSIS — Z95810 Presence of automatic (implantable) cardiac defibrillator: ICD-10-CM

## 2017-06-21 DIAGNOSIS — Z794 Long term (current) use of insulin: ICD-10-CM

## 2017-06-21 DIAGNOSIS — I5023 Acute on chronic systolic (congestive) heart failure: ICD-10-CM

## 2017-06-21 DIAGNOSIS — N183 Chronic kidney disease, stage 3 (moderate): ICD-10-CM

## 2017-06-21 DIAGNOSIS — E119 Type 2 diabetes mellitus without complications: ICD-10-CM

## 2017-06-21 DIAGNOSIS — E1143 Type 2 diabetes mellitus with diabetic autonomic (poly)neuropathy: ICD-10-CM

## 2017-06-21 DIAGNOSIS — I420 Dilated cardiomyopathy: ICD-10-CM

## 2017-06-21 DIAGNOSIS — I429 Cardiomyopathy, unspecified: Principal | ICD-10-CM

## 2017-06-21 DIAGNOSIS — I5020 Unspecified systolic (congestive) heart failure: ICD-10-CM

## 2017-06-21 DIAGNOSIS — I739 Peripheral vascular disease, unspecified: ICD-10-CM

## 2017-06-21 DIAGNOSIS — I509 Heart failure, unspecified: ICD-10-CM

## 2017-06-21 LAB — BASIC METABOLIC PANEL
Lab: 121 mg/dL — ABNORMAL HIGH (ref 70–100)
Lab: 136 MMOL/L — ABNORMAL LOW (ref 137–147)
Lab: 2 mg/dL — ABNORMAL HIGH (ref 0.4–1.24)
Lab: 32 MMOL/L — ABNORMAL HIGH (ref 21–30)
Lab: 33 mL/min — ABNORMAL LOW (ref >60)
Lab: 34 mg/dL — ABNORMAL HIGH (ref 7–25)
Lab: 4.5 MMOL/L (ref 3.5–5.1)
Lab: 40 mL/min — ABNORMAL LOW (ref >60)
Lab: 9.4 mg/dL (ref 8.5–10.6)
Lab: 98 MMOL/L (ref 98–110)
Lab: 6 (ref 3–12)

## 2017-06-21 MED ORDER — FUROSEMIDE 40 MG PO TAB
80 mg | ORAL_TABLET | Freq: Two times a day (BID) | ORAL | 3 refills | 90.00000 days | Status: AC
Start: 2017-06-21 — End: 2017-06-24

## 2017-06-21 MED ORDER — POTASSIUM CHLORIDE 20 MEQ PO TBTQ
20 meq | ORAL_TABLET | Freq: Two times a day (BID) | ORAL | 3 refills | 30.00000 days | Status: AC
Start: 2017-06-21 — End: 2017-06-24

## 2017-06-21 NOTE — Progress Notes
Date of Service: 06/21/2017    Larry Pena is a 56 y.o. male.       HPI     I had the pleasure of seeing Larry Pena in the [facility] for post infusion clinic follow up evaluation today.     He has PMH significant for NICM, combined chronic systolic and diastolic heart failure, ICD implant in 2016 with upgrade to CRT-D in 02/2016. He had cardioMEMS device placement in [place] in 2016. He had coronary angiogram in [place] as well showing no obstructive CAD. 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 His prior Echo in 05/2017 revealed LVEF 25% with LV size of 6.8 cm.  Previously in 2016 his LV size was 7.6 cm with EF of 20%     Pt was referred to HF Infusion Clinic by Dr B.Chales Abrahams after being seen in HF clinic on 05/25/17 and was found to have ~20 lbs excess fluid on board. He had a total of 8 infusion sessions and his furosemide 80mg  BID was changed to bumex 2mg  BID. His spironolactone was started at 25mg  daily on 9/1. His lisinopril was held during infusion sessions until he could return to outpatient clinic setting. His device was interrogated and revealed no arrhythmias. His amiodarone was lowered from 200mg  BID to 200mg  daily and LFTs/Thyroid studies as well as CXR and PFTs were ordered.     ???05/25/17 (Dr Chales Abrahams clinic visit) Initial weight: 308 lbs  Last infusion: 9/5: 291.2 lbs    Today he reports stable symptoms. He has noted improved urine output back on furosemide and his blood sugars are returning to baseline. He had a spike in glucose levels at home from 120s to 170-190s after we switched him from furosemide to bumetanide. He did not think there was any other change in his regimen which would have affected his glucose levels. Today he denies angina, shortness of breath at rest, orthopnea, paroxysmal nocturnal dyspnea, palpitations,  lightheadedness, near syncope and syncope. He denies recent infection or bleeding. He denies difficulty with device.    He contacted the infusion clinic last week with increasing weights and glucose levels and he was switched back to his furosemide 80mg  BID and kdur BID. He reports taking medications as directed.    Larry Pena had cardioMEMS device placed in 2016 in [place] but has not had a working pillow at home to do remote pressure reading checks since moving to [place]. He was finally willing to work with [employer] team and he now has a new pillow in place to check readings daily at home. We also helped him obtain a new Medtronic remote monitor for his CRT-D. His previous monitoring has been done through Dr. Ethelene Hal office since his device was implanted at their facility.     Patient is hoping to restart his physical therapy now that he is done in infusion clinic. Once he completes this, we discussed starting cardiac rehab and he would be eligible for coverage with his insurance.     Cardiovascular Studies  These results were discussed with the patient during his office visit.      Sodium-POC   Date Value Ref Range Status   06/10/2017 137 137 - 147 MMOL/L Final     Potassium-POC   Date Value Ref Range Status   06/10/2017 4.0 3.5 - 5.1 MMOL/L Final     Chloride, POC  Date Value Ref Range Status   06/10/2017 96 (L) 98 - 110 MMOL/L Final     CO2, POC   Date Value Ref Range Status   06/10/2017 30 21 - 30 MMOL/L Final     Anion Gap, POC   Date Value Ref Range Status   06/10/2017 16 (H) 3 - 12 Final     Bun, POC   Date Value Ref Range Status   06/10/2017 37 (H) 7 - 25 MG/DL Final     Ionized Calcium-POC   Date Value Ref Range Status   06/10/2017 1.07 1.0 - 1.3 MMOL/L Final     Glucose, POC   Date Value Ref Range Status   06/10/2017 173 (H) 70 - 100 MG/DL Final     BNP POC   Date Value Ref Range Status   05/27/2017 430.0 (H) 0 - 100 PG/ML Final     Comprehensive Metabolic Profile    Lab Results   Component Value Date/Time NA 136 (L) 06/21/2017 09:55 AM    K 4.5 06/21/2017 09:55 AM    CL 98 06/21/2017 09:55 AM    CO2 32 (H) 06/21/2017 09:55 AM    GAP 6 06/21/2017 09:55 AM    BUN 34 (H) 06/21/2017 09:55 AM    CR 2.06 (H) 06/21/2017 09:55 AM    GLU 121 (H) 06/21/2017 09:55 AM    Lab Results   Component Value Date/Time    CA 9.4 06/21/2017 09:55 AM    ALBUMIN 3.9 06/08/2017 10:34 AM    TOTPROT 6.8 06/08/2017 10:34 AM    ALKPHOS 50 06/08/2017 10:34 AM    AST 30 06/08/2017 10:34 AM    ALT 29 06/08/2017 10:34 AM    TOTBILI 0.7 06/08/2017 10:34 AM    GFR 33 (L) 06/21/2017 09:55 AM    GFRAA 40 (L) 06/21/2017 09:55 AM          Assessment    1. Chronic combined HFrEF NYHA Class III, stage C. CardioMEMS PAD down to 25 today (Starting around 38-42 at start of infusion sessions and was 28 day of dc from infusion). Will continue furosemide 80mg  BID but have low threshold to increase to 120mg  BID if PAD trends above 25. His goal over next 4-8 weeks will be to get PAD <20. Ideally can add back lisinopril for vasodilator therapy but may need to use hydralazine/nitrates if necessary for renal function.  Discussed it would be very helpful in future to have daily MEMS readings to help US guide his diuretic regimen. His pillow wasn't working well at home today so he will call [employer] when he returns back home this afternoon.Recommend cardiac rehab referral once he completes physical therapy for his left leg.   2. Non-ischemic cardiomyopathy, on suboptimal neurohormonal antagonist (NHA). Previously taking carvedilol 3.125 mg bid, lisinopril 2.5 mg daily, spironolactone 25 mg daily. On 8/24 he was instructed to hold his lisinopril and spironolactone due to elevated renal function. We restarted spironolactone 25mg  daily on 9/1. He recently had Echo completed on 8/21 with LVEF 25%, LV 6.8cm and mild-mod AI with moderate dilation of ascending aorta. Prior echo in 11/2015 showed EF 15-20% with grade I diastolic dysfunction. 3. 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 Creatinine today is 2.0. Recent range has been 1.5-2.0. Will continue to monitor closely.   4. Coronary Artery Disease surveillance. LHC in 2016 was negative for obstructive CAD. No current symptoms of ischemia.Will continue on his Asa 81 mg daily and simvastatin 20 mg qhs.  5. Hypertension. Blood pressure: 110/70. Currently taking carvedilol 3.125 mg po bid.  6. CRT-D. Recently upgraded to CRT-D summer of 2017 per [facility] cardiology at [facility]. Interrogation on 9/4 reveals 99.8% bi-v pacing, no atrial or ventricular arrhythmias and improvement in thoracic impedence in past few days.  He reports receiving new medtronic monitor for home. Will see if device team has received his set up interrogation.  7. Atrial arrhythmias. Continues chronic anticoagulation with eliquis 5 mg bid. Appears he may have been on amiodarone 200mg  BID since his initial episode of afib and cardioversion at [facility] back in 2017. Reduced amiodarone to 200mg  daily on 8/31 and LFTs, Thyroid studies were normal on 9/4. CXR and PFT also have been performed.   8. Chronic low back pain. He is planning to undergo spinal injection in upcoming month with Dr. Bernette Mayers in [place], [place] (phone # [phone], fax # [phone]); [facility] has faxed a letter to Dr Bernette Mayers stating it's ok for him to be off of his eliquis for 5-7 days prior to having spinal injection.   9. DM 2. Currently taking Toujeo 40 units subq QHS, Novolog 15 units subq tid with meals. Last HbgA1c 10.0 in 11/2015; Now on 8/20 A1C was 6.4 and he is following with PCP, Dr. Stephens Shire at [place] [facility] regarding his diabetes.   10. Daytime somnolence. He reports having difficulty sleeping at night, states he often requires naps during the day. Also states he has been told he snores, has never undergone sleep study. He is agreeable to sleep study evaluation, referral sent however may be challenging as [place] medicaid rarely covers sleep study.  Recommended he try melatonin to assist with falling asleep, also discussed sleep hygiene.     Plan:  The following is a copy of the written instructions and plan I gave to the patient during his office visit.  1. Continue furosemide 80mg  am and 80mg  in afternoon  2. Continue potassium am and pm  3. If PAD consistently >25, we will recommend increasing furosemide to 120mg  twice daily  4. I will ask pacemaker team to make sure they received a remote medtronic reading from your defibrillator- we want to monitor- not have at [facility] office  5. Be sure to follow up with Dr. Herschell Dimes about your diabetes  6. Continue physical therapy- when this is done- would recommend cardiac rehab- you qualify for this and [place] medicaid will cover this benefit!! [facility] should be able to do this at [facility]   7. Make sure you are working on daily CardioMEMs readings on your pillow. If pillow not working, call [employer] and if still not working, they can send someone to your home. 1-800 number on the pillow  8. Hold off on lisinopril for now- maybe we can restart when you see Dr. Chales Abrahams     * Labs today: BMP today is stable    * Follow Up:   1. Dr. Chales Abrahams on 10/16 at 0930   2. Device check at visit          Future Appointments  Date Time Provider Department Center   06/21/2017 5:30 PM [facility] REMOTE MONITORING MACREMOTEHRM MAC Remote   07/20/2017 9:30 AM Renita Papa, MBBS MACH[facility] MAC [place]   10/15/2017 10:00 AM Julio Alm, APRN IMDIABTS [facility] IM           Education/counseling time spent: 30  minutes of 35 minute visit spent on counseling and coordination of care.   Topics:  HF disease process, Treatment options, Treatment risks and benefits, Medication instructions, Medication interactions, Daily weight monitoring, Sodium restriction, Personalized exercise guidelines, device management, CardioMEMS, Knows HF symptoms, Knows when to call, Knows who to call, Knows next appt.         Vitals:    06/21/17 1008   BP: 110/70   Pulse: 69   SpO2: 96%   Weight: 132.5 kg (292 lb)   Height: 1.829 m (6')     Body mass index is 39.6 kg/m???.     Past Medical History  Patient Active Problem List    Diagnosis Date Noted   ??? Paroxysmal atrial fibrillation (HCC) 05/27/2017     05/27/17 on Eliquis 5 mg bid      ??? Cellulitis of right lower limb 05/27/2017   ??? Chronic kidney disease, stage 3 (moderate) (HCC) 05/27/2017   ??? Depression 05/27/2017   ??? Diabetic polyneuropathy associated with type 2 diabetes mellitus (HCC) 05/27/2017   ??? Pain of foot 05/27/2017   ??? History of MI (myocardial infarction) 05/27/2017   ??? Low back pain 05/27/2017   ??? Radiculopathy, lumbar region 05/27/2017   ??? Acute on chronic systolic (congestive) heart failure (HCC) 05/25/2017   ??? Dilated cardiomyopathy (HCC) 12/04/2015   ??? Community acquired pneumonia 08/06/2015   ??? Achalasia of esophagus 06/21/2015   ??? Cardiac defibrillator in place 06/21/2015     Placed in January, 2016    Medtronic DDD-ICD     ??? Type 2 diabetes mellitus with foot ulcer (CODE) (HCC) 06/18/2015   ??? Dyslipidemia 06/18/2015   ??? Hypertension 06/18/2015   ??? Diabetic neuropathy (HCC) 06/18/2015   ??? Syncope 06/18/2015   ??? Chronic systolic heart failure (HCC) 06/18/2015     cardiomems placement June 2016  Basline PA 55/33    ECHO 04/12/2014: LVEF 50-55%  Mildly dilated LV, normal wall motion, grade 2 diastolic dysfunction, L atrium is mildly dilated, mild aortic regurgitation, mildly dilated aortic root     Cardiac Cath:  04/18/2013: L Heart Cath/L Ventriculography/Selective coronary angiography  -normal coronary arteries, LV is dilated and has reduced EF of 41%    03/05/2015: R Heart Cath, Pulmonary angiography, placement of cardioMEM   -CI Fick 2.1, CO Fick 5., PA Sat 60.5, RV 60/16, PA 67/34, PCW 17             Review of Systems   Constitution: Negative.   HENT: Negative.    Eyes: Negative.    Cardiovascular: Negative. Respiratory: Negative.    Endocrine: Negative.    Hematologic/Lymphatic: Negative.    Skin: Negative.    Musculoskeletal: Negative.    Gastrointestinal: Negative.    Genitourinary: Negative.    Neurological: Negative.    Psychiatric/Behavioral: Negative.    Allergic/Immunologic: Negative.    All other systems reviewed and are negative.      Physical Exam  General Appearance: no acute distress  Skin: warm and dry  Digits and Nails: normal color, smooth symmetric nails and digits  Eyes: conjunctivae and lids normal  Neck Veins: JVP 8 cm, -HJR  Chest Inspection: L chest ICD pocket healed  Auscultation: breathing comfortably, lungs clear to auscultation, no rales or rhonchi, no wheezing  Cardiac Auscultation: Regular rhythm, S1, S2, no S3 or S4, no murmur  Radial Arteries: normal symmetric radial pulses  Pedal Pulses: normal symmetric pedal pulses  Lower Extremity Edema: 1mm bilat edema  Abdominal Exam: soft, round, obese, non-tender, bowel sounds normal  Gait & Station: gait stable, uses cane  Muscle  Strength: normal strength and tone  Orientation: clear historian, good insight             Current Medications (including today's revisions)  ??? amiodarone (CORDARONE) 200 mg tablet Take one tablet by mouth daily. Take with food.   ??? apixaban (ELIQUIS) 5 mg tablet Take 5 mg by mouth twice daily.   ??? aspirin EC 81 mg tablet Take 81 mg by mouth daily. Take with food.    ??? BD ULTRA-FINE SHORT PEN NEEDLE 31 gauge x 5/16 pen needle Use as directed   ??? Blood Pressure Monitor (BLOOD PRESSURE KIT) kit Use as directed   ??? bumetanide (BUMEX) 1 mg tablet Take two tablets by mouth twice daily.   ??? buPROPion SR(+) (WELLBUTRIN, ZYBAN) 200 mg tablet Take 200 mg by mouth daily.   ??? carvedilol (COREG) 3.125 mg tablet Take 3.125 mg by mouth twice daily with meals. Take with food.    ??? furosemide (LASIX) 40 mg tablet Take 40 mg by mouth twice daily.   ??? HYDROcodone/acetaminophen(+) (NORCO) 10/325 mg tablet Take 1 tablet by mouth every 6 hours as needed for Pain   ??? insulin glargine (TOUJEO SOLOSTAR) 300 unit/mL (1.5 mL) injectable Inject 35 Units under the skin at bedtime daily.   ??? nitroglycerin (NITROSTAT) 0.4 mg tablet Place 0.4 mg under tongue every 5 minutes as needed for Chest Pain. Max of 3 tablets, call 911.    ??? NOVOLOG FLEXPEN U-100 INSULIN 100 unit/mL injection PEN Inject 15 Units under the skin three times daily with meals.   ??? omeprazole DR(+) (PRILOSEC) 40 mg capsule Take 40 mg by mouth daily before breakfast.   ??? potassium chloride SR (K-DUR) 10 mEq tablet Take 10 mEq by mouth twice daily. Take with a meal and a full glass of water.    ??? pregabalin (LYRICA) 150 mg capsule Take 150 mg by mouth three times daily.   ??? simvastatin (ZOCOR) 20 mg tablet Take 20 mg by mouth at bedtime daily.   ??? spironolactone (ALDACTONE) 25 mg tablet Take one tablet by mouth daily. Take with food.   ??? TRUE METRIX GLUCOSE TEST STRIP test strip Use as directed   ??? UNILET LANCET 33 gauge Use as directed

## 2017-06-22 ENCOUNTER — Encounter: Admit: 2017-06-22 | Discharge: 2017-06-22 | Payer: MEDICAID

## 2017-06-22 NOTE — Telephone Encounter
Pt connected and schedule set up. Called pt to let him know from this point forward he just needed to keep his home monitor by his bedside and it would automatically send. He could call back if needed as well.

## 2017-06-22 NOTE — Telephone Encounter
-----   Message from Foye Deer, [place] sent at 06/21/2017  4:13 PM CDT -----  Regarding: FW: establish remote      ----- Message -----  From: Harlene Salts, APRN-C  Sent: 06/21/2017   4:06 PM  To: Cherlyn [facility] Remote  Subject: establish remote                                 Hi!  Patient had his CRT-D upgrade with Dr. Nelva Bush at outside hospital in 02/2016. Pt is going to follow with Annabelle Harman here at [facility]. When Stacia came up to interrogate device in infusion clinic he told her his medtronic home machine was the original from [place] (implant of his ICD) and he didn't get a new one at time of his upgrade to CRT-D last summer. She said she would ask to get him a new monitor.   Pt told me at appt today the new monitor came but he is a little confused between his CRT-D and cardiomems. He said he plugged in his monitor. I asked if he did manual download to set it to his device and he said "no".   Did we get one in past week or so? If not, could someone touch base with him and tell him how to send and get him set up on remote schedule please?   Thanks CR

## 2017-06-24 ENCOUNTER — Encounter: Admit: 2017-06-24 | Discharge: 2017-06-24 | Payer: MEDICAID

## 2017-06-24 DIAGNOSIS — I5022 Chronic systolic (congestive) heart failure: Principal | ICD-10-CM

## 2017-06-24 MED ORDER — POTASSIUM CHLORIDE 20 MEQ PO TBTQ
30 meq | ORAL_TABLET | Freq: Two times a day (BID) | ORAL | 3 refills | 30.00000 days | Status: AC
Start: 2017-06-24 — End: 2017-06-25

## 2017-06-24 MED ORDER — FUROSEMIDE 40 MG PO TAB
120 mg | ORAL_TABLET | Freq: Two times a day (BID) | ORAL | 11 refills | 90.00000 days | Status: AC
Start: 2017-06-24 — End: 2017-06-25

## 2017-06-25 MED ORDER — FUROSEMIDE 40 MG PO TAB
ORAL_TABLET | Freq: Two times a day (BID) | ORAL | 11 refills | 90.00000 days | Status: AC
Start: 2017-06-25 — End: 2017-08-19

## 2017-06-25 MED ORDER — FUROSEMIDE 80 MG PO TAB
ORAL_TABLET | Freq: Two times a day (BID) | ORAL | 11 refills | 90.00000 days | Status: AC
Start: 2017-06-25 — End: 2017-07-07

## 2017-06-25 MED ORDER — POTASSIUM CHLORIDE 10 MEQ PO TBER
30 meq | ORAL_CAPSULE | Freq: Two times a day (BID) | ORAL | 11 refills | 30.00000 days | Status: AC
Start: 2017-06-25 — End: 2017-07-07

## 2017-06-25 NOTE — Telephone Encounter
Patient returned my call this morning and verbalizes understanding of medication changes.   Patient requesting lasix 80mg  tablets to be called into pharmacy. Patient already has lasix 40mg  tablets at home. He will take one 80mg  tablet + one 40mg  tablet Twice daily to =120mg  BID.   Patient requesting smaller potassium tablets because he is having difficulty swallowing 13meq tablets. CardioMEMS Coordinator will send script for potassium chloride 75meq tablets.

## 2017-06-25 NOTE — Telephone Encounter
Called Dr. Rob Bunting office, office unable to locate fax sent 8/28. Confirmed correct office name and fax number below. Second copy faxed to office attn: Santiago Glad 06/25/17

## 2017-06-28 ENCOUNTER — Encounter: Admit: 2017-06-28 | Discharge: 2017-06-28 | Payer: MEDICAID

## 2017-06-30 ENCOUNTER — Encounter: Admit: 2017-06-30 | Discharge: 2017-06-30 | Payer: MEDICAID

## 2017-06-30 DIAGNOSIS — I5020 Unspecified systolic (congestive) heart failure: Principal | ICD-10-CM

## 2017-06-30 NOTE — Progress Notes
Case Management Note    Plan: cardiac rehab referral    Intervention: EMR reviewed. Pt is a 56 y/o M with HFrEF: 25% EF per latest echo. IB received from LS to send referral for cardiac rehab in [place] per pt's request. Referral to [facility]. Fax confirmation received.     NCM lvm to [place] cardiac rehab re: referral.    [facility] Cardiac Rehab: [phone]; [phone]    Abegail T. London Pepper RN, BSN  Nurse Case Manager  [phone]

## 2017-07-01 ENCOUNTER — Encounter: Admit: 2017-07-01 | Discharge: 2017-07-01 | Payer: Medicaid Other

## 2017-07-01 NOTE — Telephone Encounter
Case Management Note    Plan/Intervention: cardica rehab referral f/u. Signed order faxed to [facility]. NCM received a call back from [place] who acknowledged receipt of clinicals and referral. Diane said they will call pt to schedule. NCM appreciative of update. Telephone call concluded.    Abegail T. London Pepper RN, BSN  Nurse Case Manager  [phone]

## 2017-07-01 NOTE — Telephone Encounter
Date & Time  07/12/2017 10:00 AM Provider  [facility]     Coordinated PFT testing for patient due to transportation schedule. Called and made patient aware, patient verbalized understanding, provided instructions to medical office building.

## 2017-07-01 NOTE — Telephone Encounter
-----   Message from Faythe Ghee, RN sent at 06/30/2017  5:46 PM CDT -----  Can either of you (non urgent) please call patient and request he follow-up scheduling PFT test for amiodarone monitoring  He cancelled Sep Test- he could attempt to scheduled same day when he sees [facility] 10/16    Pulmonary Lab [phone]    Thanks!

## 2017-07-01 NOTE — Telephone Encounter
Call placed to pt to request he call pulmonary function lab and schedule PFT for amiodarone monitoring. Left detailed msg to call [phone] and can schedule same day as OV with Dr Lyndel Safe on 07/20/17. Requested cb to confirm receipt of msg.

## 2017-07-02 ENCOUNTER — Encounter: Admit: 2017-07-02 | Discharge: 2017-07-02 | Payer: MEDICAID

## 2017-07-02 LAB — BASIC METABOLIC PANEL
Lab: 139
Lab: 16
Lab: 2
Lab: 252
Lab: 29
Lab: 30
Lab: 4.5
Lab: 9.4
Lab: 98

## 2017-07-07 ENCOUNTER — Encounter: Admit: 2017-07-07 | Discharge: 2017-07-07 | Payer: MEDICAID

## 2017-07-07 DIAGNOSIS — I5022 Chronic systolic (congestive) heart failure: Principal | ICD-10-CM

## 2017-07-07 MED ORDER — TORSEMIDE 100 MG PO TAB
ORAL_TABLET | Freq: Every morning | ORAL | 11 refills | 67.50000 days | Status: AC
Start: 2017-07-07 — End: 2017-07-20

## 2017-07-07 MED ORDER — POTASSIUM CHLORIDE 10 MEQ PO TBER
40 meq | ORAL_CAPSULE | Freq: Two times a day (BID) | ORAL | 11 refills | 30.00000 days | Status: AC
Start: 2017-07-07 — End: 2017-08-13

## 2017-07-12 ENCOUNTER — Ambulatory Visit: Admit: 2017-07-12 | Discharge: 2017-07-12 | Payer: MEDICAID

## 2017-07-12 DIAGNOSIS — I5022 Chronic systolic (congestive) heart failure: ICD-10-CM

## 2017-07-12 DIAGNOSIS — Z79899 Other long term (current) drug therapy: ICD-10-CM

## 2017-07-12 DIAGNOSIS — Z5181 Encounter for therapeutic drug level monitoring: Principal | ICD-10-CM

## 2017-07-15 ENCOUNTER — Encounter: Admit: 2017-07-15 | Discharge: 2017-07-15 | Payer: MEDICAID

## 2017-07-15 ENCOUNTER — Encounter: Admit: 2017-07-15 | Discharge: 2017-07-16 | Payer: MEDICAID

## 2017-07-20 ENCOUNTER — Encounter: Admit: 2017-07-20 | Discharge: 2017-07-20 | Payer: Medicaid Other

## 2017-07-20 ENCOUNTER — Ambulatory Visit: Admit: 2017-07-20 | Discharge: 2017-07-20 | Payer: MEDICAID

## 2017-07-20 ENCOUNTER — Ambulatory Visit: Admit: 2017-07-20 | Discharge: 2017-07-21 | Payer: MEDICAID

## 2017-07-20 DIAGNOSIS — I739 Peripheral vascular disease, unspecified: ICD-10-CM

## 2017-07-20 DIAGNOSIS — I429 Cardiomyopathy, unspecified: Principal | ICD-10-CM

## 2017-07-20 DIAGNOSIS — Z95810 Presence of automatic (implantable) cardiac defibrillator: ICD-10-CM

## 2017-07-20 DIAGNOSIS — I5043 Acute on chronic combined systolic (congestive) and diastolic (congestive) heart failure: ICD-10-CM

## 2017-07-20 DIAGNOSIS — N183 Chronic kidney disease, stage 3 (moderate): ICD-10-CM

## 2017-07-20 DIAGNOSIS — I5022 Chronic systolic (congestive) heart failure: Principal | ICD-10-CM

## 2017-07-20 DIAGNOSIS — E119 Type 2 diabetes mellitus without complications: ICD-10-CM

## 2017-07-20 DIAGNOSIS — I509 Heart failure, unspecified: ICD-10-CM

## 2017-07-20 LAB — BASIC METABOLIC PANEL
Lab: 134 MMOL/L — ABNORMAL LOW (ref 137–147)
Lab: 2 mg/dL — ABNORMAL HIGH (ref 0.4–1.24)
Lab: 260 mg/dL — ABNORMAL HIGH (ref 70–100)
Lab: 30 MMOL/L (ref 21–30)
Lab: 33 mL/min — ABNORMAL LOW (ref >60)
Lab: 33 mg/dL — ABNORMAL HIGH (ref 7–25)
Lab: 4.7 MMOL/L (ref 3.5–5.1)
Lab: 40 mL/min — ABNORMAL LOW (ref >60)
Lab: 6 (ref 3–12)
Lab: 9.1 mg/dL (ref 8.5–10.6)
Lab: 98 MMOL/L (ref 98–110)

## 2017-07-20 MED ORDER — TORSEMIDE 100 MG PO TAB
ORAL_TABLET | Freq: Every morning | ORAL | 2 refills | 67.50000 days | Status: AC
Start: 2017-07-20 — End: 2017-07-22

## 2017-07-22 ENCOUNTER — Encounter: Admit: 2017-07-22 | Discharge: 2017-07-22 | Payer: MEDICAID

## 2017-07-22 MED ORDER — TORSEMIDE 100 MG PO TAB
100 mg | ORAL_TABLET | Freq: Two times a day (BID) | ORAL | 2 refills | 67.50000 days | Status: AC
Start: 2017-07-22 — End: 2017-10-12

## 2017-07-27 ENCOUNTER — Encounter: Admit: 2017-07-27 | Discharge: 2017-07-27 | Payer: Medicaid Other

## 2017-07-27 ENCOUNTER — Ambulatory Visit: Admit: 2017-07-27 | Discharge: 2017-07-28 | Payer: MEDICAID

## 2017-07-27 DIAGNOSIS — I5022 Chronic systolic (congestive) heart failure: Principal | ICD-10-CM

## 2017-07-28 DIAGNOSIS — I5022 Chronic systolic (congestive) heart failure: Principal | ICD-10-CM

## 2017-08-12 ENCOUNTER — Encounter: Admit: 2017-08-12 | Discharge: 2017-08-12 | Payer: MEDICAID

## 2017-08-12 NOTE — Progress Notes
PA for potassium supplement completed via Cover-My-Meds. Awaiting determination.

## 2017-08-13 ENCOUNTER — Encounter: Admit: 2017-08-13 | Discharge: 2017-08-13 | Payer: Medicaid Other

## 2017-08-13 MED ORDER — POTASSIUM CHLORIDE 10 MEQ PO TBER
40 meq | ORAL_CAPSULE | Freq: Two times a day (BID) | ORAL | 3 refills | 30.00000 days | Status: AC
Start: 2017-08-13 — End: 2018-02-18

## 2017-08-13 NOTE — Progress Notes
Response to Prior Authorization fax received regarding refill on Potassium. Call placed to [facility] and spoke to [place]. She informed this medication requires 90 day supply. Lenda Kelp ran test claim with # [id number] and was approved. Updated script sent to pharmacy. Called pharmacy and spoke to Laura to inform. Called pt and informed him of PA approval and to see where he had repeat BMP. Pt had labs drawn at [facility] and spoke to [place] and he will fax BMP results to R fax.

## 2017-08-13 NOTE — Telephone Encounter
Erroneous encounter-disregard

## 2017-08-19 ENCOUNTER — Encounter: Admit: 2017-08-19 | Discharge: 2017-08-19 | Payer: MEDICAID

## 2017-08-19 DIAGNOSIS — N183 Chronic kidney disease, stage 3 (moderate): ICD-10-CM

## 2017-08-19 DIAGNOSIS — I5023 Acute on chronic systolic (congestive) heart failure: Principal | ICD-10-CM

## 2017-08-19 NOTE — Telephone Encounter
PAD elevated on CardioMEMS readings (see below).  Attempted to call patient for symptom and blood pressure check, but no answer--left VM requesting call back.         Daily CardioMEMS Readings  PA Diastolic Thresholds: 18-24mmHg  Taken on PA Systolic PA Diastolic PA Mean Heart Rate   [id number], 08:37 AM 59 mmHg 35 mmHg 43 mmHg 70 bpm   08-19-2017, 08:33 AM 61 mmHg 32 mmHg 42 mmHg 75 bpm   08-17-2017, 10:54 AM 52 mmHg 29 mmHg 36 mmHg 70 bpm   08-15-2017, 12:00 PM 51 mmHg 30 mmHg 36 mmHg 70 bpm   08-13-2017, 04:39 PM 60 mmHg 30 mmHg 41 mmHg 77 bpm   08-12-2017, 09:03 AM 55 mmHg 27 mmHg 36 mmHg 76 bpm   08-10-2017, 07:47 AM 53 mmHg 30 mmHg 36 mmHg 70 bpm         CARDIAC MEDS:  ??? Torsemide 100mg  BID  ??? Potassium BID  ??? Spironolactone 25mg  Daily  ??? Carvedilol 3.125 BID  ??? Amiodarone 200mg  daily      RECENT LABS:    Basic Metabolic Profile    Lab Results   Component Value Date/Time    NA 134 (L) 07/20/2017 11:19 AM    K 4.7 07/20/2017 11:19 AM    CA 9.1 07/20/2017 11:19 AM    CL 98 07/20/2017 11:19 AM    CO2 30 07/20/2017 11:19 AM    GAP 6 07/20/2017 11:19 AM    Lab Results   Component Value Date/Time    BUN 33 (H) 07/20/2017 11:19 AM    CR 2.09 (H) 07/20/2017 11:19 AM    GLU 260 (H) 07/20/2017 11:19 AM              Patient saw Hina Baig on 10/16. Per Hina's note on 10/16: Acute on chronic combined???HFrEF???NYHA Class III, stage???C. CardioMEMS PAD is down to 29 mmHg this morning (from 31 mmHg on 10/3).  His Lasix was changed to torsemide 100 mg in AM and 50 mg in PM that he has been taking since 07/12/17.  I offered him to go to our Ambulatory [facility] Infusion Clinic, however, patient does not want to at this time due to the cost.  I have instructed him to increase oral torsemide to 100 mg twice daily.  I am sending him to the lab for BMP check, and will contact him later once results are back to see if any other medication changes, including Potassium supplementation increase is warranted or not.  The goal is to gradually bring PAD <20. Ideally, can add back lisinopril for vasodilator therapy but may need to use hydralazine/nitrates if necessary for renal function.        Routing to Dr. Chales Abrahams for review and recommendations.

## 2017-08-20 MED ORDER — METOLAZONE 2.5 MG PO TAB
ORAL_TABLET | ORAL | 0 refills | 84.00000 days | Status: AC
Start: 2017-08-20 — End: 2017-10-12

## 2017-08-20 NOTE — Telephone Encounter
Call placed to pt to inform of new orders below. Left detailed message on authorized VM with request to call back to let us know he received and understands orders. Script sent to [facility] in [place], [place].  BMP ordered and scheduled.     Faythe Ghee, RN  [facility]   Caller: Unspecified Wilburn Mylar, 4:36 PM)            Reviewed with Dr. Lyndel Safe - Take 1 tablet of 2.5 mg metolazone once. (send RX for 10 tablets). Patient to have BMP at [place] Tuesday with Ginger Organ and check cardiomems readings to monitor trend. Patient needs to restrict fluids to 60 oz daily.

## 2017-08-20 NOTE — Telephone Encounter
Call placed to pt to get update on weights and symptoms. Pt reports he went to his PCP last week d/t having bronchitis past 3 wks and was told he had pneumonia. No CXR was ordered.  Pt just completed 7 days of antibiotic but could not remember the name of the med. No prednisone or pred pak was ordered. Pt reports the following symptoms:    1) weights stable within 3 lbs from 188.0  2) worsening cough with yellow/brown expectorant, increases when lying down  3) no abdominal distention or BLE edema  4) B/P today 116/69 HR 70, states I went to the doctor to have my stitches removed from procedure to remove some bumps on his neck and was told his B/P was 96 over 27. They did not recheck B/P and no concern was addressed.   5) no lightheadedness, dizziness  6) states he drinks a lot of fluids d/t dysphagia, he is having a swallow study tomorrow for this  7) reports compliance with cardiac meds, no missed doses    Pt said his cough was worse than it ever was last night. Reviewed OTC meds with pt and advised him to f/u with PCP or urgent care if needed to address cough today. Mailed copy of OTC meds to pt.     Pad today 34. Confirmed next OV 08/24/17 at 1100 with Raelyn Number, APRN. Pt agreed to come in at Informed pt we will forward to Dr. Chales Abrahams for review and cb with recommendations.     PAD Goal 18-25  Taken on PA Systolic PA Diastolic PA Mean Heart Rate    08-20-2017, 09:09 AM 62 mmHg 34 mmHg 43 mmHg 70 bpm    08-19-2017, 08:37 AM 59 mmHg 35 mmHg 43 mmHg 70 bpm    08-19-2017, 08:33 AM 61 mmHg 32 mmHg 42 mmHg 75 bpm    08-17-2017, 10:54 AM 52 mmHg 29 mmHg 36 mmHg 70 bpm    08-15-2017, 12:00 PM 51 mmHg 30 mmHg 36 mmHg 70 bpm    08-13-2017, 04:39 PM 60 mmHg 30 mmHg 41 mmHg 77 bpm    08-12-2017, 09:03 AM 55 mmHg 27 mmHg 36 mmHg 76 bpm    08-10-2017, 07:47 AM 53 mmHg 30 mmHg 36 mmHg 70 bpm    08-08-2017, 02:07 PM 55 mmHg 31 mmHg 39 mmHg 69 bpm    08-07-2017, 09:33 AM 57 mmHg 31 mmHg 40 mmHg 70 bpm 08-06-2017, 02:21 PM 56 mmHg 32 mmHg 40 mmHg 70 bpm    08-03-2017, 01:31 PM 64 mmHg 35 mmHg 46 mmHg 70 bpm    08-02-2017, 05:11 PM 63 mmHg 33 mmHg 44 mmHg 72 bpm    08-01-2017, 10:38 AM 50 mmHg 30 mmHg 36 mmHg 70 bpm    07-31-2017, 02:58 PM 51 mmHg 28 mmHg 35 mmHg 70 bpm    07-29-2017, 08:56 AM 49 mmHg 27 mmHg 34 mmHg 70 bpm

## 2017-08-23 NOTE — Telephone Encounter
Pt called and asked if lab could be faxed to lab, called a LM that lab order was faxed to [facility] lab.

## 2017-08-23 NOTE — Telephone Encounter
PAD 34 today. Call placed to pt to get update on weights and sx's. And to verify he did picked up metolazone. OV cancelled with Ginger Organ on 11/20. Will need to have pt get labs locally and reschedule OV.

## 2017-08-23 NOTE — Telephone Encounter
Pt called back, called and LM asking if orders below were followed. Asked he return call to clarify.

## 2017-08-25 NOTE — Telephone Encounter
Called pt x 2 today to verify if he had labs drawn, LM to cb. called [place] and was informed pt has not been in this week for labs.

## 2017-08-25 NOTE — Telephone Encounter
PAD 30-- VO from Dr. Lyndel Safe to have pt take 2.5 mg metolazone.left detailed message.  DLM

## 2017-08-31 ENCOUNTER — Ambulatory Visit: Admit: 2017-08-31 | Discharge: 2017-09-01 | Payer: MEDICAID

## 2017-08-31 ENCOUNTER — Encounter: Admit: 2017-08-31 | Discharge: 2017-08-31 | Payer: MEDICAID

## 2017-08-31 LAB — BASIC METABOLIC PANEL
Lab: 137
Lab: 3 — ABNORMAL HIGH (ref 0.72–1.25)
Lab: 3.4 — ABNORMAL LOW (ref 3.5–5.1)
Lab: 33 — ABNORMAL HIGH (ref 22–29)
Lab: 61 — ABNORMAL HIGH (ref 8.4–25.7)
Lab: 87 — ABNORMAL LOW (ref 98–107)

## 2017-09-01 ENCOUNTER — Encounter: Admit: 2017-09-01 | Discharge: 2017-09-01 | Payer: MEDICAID

## 2017-09-01 ENCOUNTER — Encounter: Admit: 2017-09-01 | Discharge: 2017-09-01 | Payer: Medicaid Other

## 2017-09-01 DIAGNOSIS — I5022 Chronic systolic (congestive) heart failure: Principal | ICD-10-CM

## 2017-09-01 LAB — BASIC METABOLIC PANEL: Lab: 20 — AB (ref 0–14)

## 2017-09-03 ENCOUNTER — Encounter: Admit: 2017-09-03 | Discharge: 2017-09-03 | Payer: Medicaid Other

## 2017-09-03 DIAGNOSIS — I5022 Chronic systolic (congestive) heart failure: Principal | ICD-10-CM

## 2017-09-07 ENCOUNTER — Encounter: Admit: 2017-09-07 | Discharge: 2017-09-07 | Payer: Medicaid Other

## 2017-09-07 NOTE — Telephone Encounter
PAD 100 yesterday. No readings transmitted 11/30 thru 12/2. No   CardioMEMS readings yet today.  Called to remind patient to send CardioMEMS reading this afternoon--no answer. Left VM requesting call back.       Daily CardioMEMS Readings    Goal PA Diastolic: 76-54 mmHg **may need to adjust goal PAD--will reassess after review of BMP results on 09/10/17.   PAD Diastolic Thresholds: [id number]    Taken on PA Systolic PA Diastolic PA Mean Heart Rate   09-06-2017, 08:48 AM 63 mmHg 36 mmHg 45 mmHg 70 bpm   09-02-2017, 08:32 AM 57 mmHg 31 mmHg 40 mmHg 71 bpm   09-01-2017, 11:10 AM 51 mmHg 30 mmHg 37 mmHg 71 bpm   08-31-2017, 02:18 PM 56 mmHg 30 mmHg 40 mmHg 73 bpm   08-30-2017, 10:03 AM 51 mmHg 29 mmHg 37 mmHg 75 bpm   08-28-2017, 09:45 AM 52 mmHg 29 mmHg 36 mmHg 77 bpm   08-27-2017, 10:32 AM 55 mmHg 31 mmHg 39 mmHg 69 bpm   08-26-2017, 12:17 PM 49 mmHg 31 mmHg 36 mmHg 76 bpm   08-25-2017, 01:33 PM 55 mmHg 30 mmHg 38 mmHg 78 bpm

## 2017-09-10 ENCOUNTER — Encounter: Admit: 2017-09-10 | Discharge: 2017-09-10 | Payer: MEDICAID

## 2017-09-10 ENCOUNTER — Ambulatory Visit: Admit: 2017-09-10 | Discharge: 2017-09-10 | Payer: MEDICAID

## 2017-09-10 DIAGNOSIS — I10 Essential (primary) hypertension: ICD-10-CM

## 2017-09-10 DIAGNOSIS — Z95810 Presence of automatic (implantable) cardiac defibrillator: ICD-10-CM

## 2017-09-10 DIAGNOSIS — I480 Paroxysmal atrial fibrillation: ICD-10-CM

## 2017-09-10 DIAGNOSIS — I429 Cardiomyopathy, unspecified: Principal | ICD-10-CM

## 2017-09-10 DIAGNOSIS — I5022 Chronic systolic (congestive) heart failure: Principal | ICD-10-CM

## 2017-09-10 DIAGNOSIS — I739 Peripheral vascular disease, unspecified: ICD-10-CM

## 2017-09-10 DIAGNOSIS — E119 Type 2 diabetes mellitus without complications: ICD-10-CM

## 2017-09-10 DIAGNOSIS — I509 Heart failure, unspecified: ICD-10-CM

## 2017-09-10 LAB — BASIC METABOLIC PANEL
Lab: 135 MMOL/L — ABNORMAL LOW (ref 137–147)
Lab: 2.1 mg/dL — ABNORMAL HIGH (ref 0.4–1.24)
Lab: 30 mg/dL — ABNORMAL HIGH (ref 7–25)
Lab: 31 mL/min — ABNORMAL LOW (ref >60)
Lab: 34 MMOL/L — ABNORMAL HIGH (ref 21–30)
Lab: 377 mg/dL — ABNORMAL HIGH (ref 70–100)
Lab: 38 mL/min — ABNORMAL LOW (ref >60)
Lab: 4.9 MMOL/L (ref 3.5–5.1)
Lab: 9.2 mg/dL (ref 8.5–10.6)
Lab: 95 MMOL/L — ABNORMAL LOW (ref 98–110)
Lab: 6 (ref 3–12)

## 2017-09-10 NOTE — Telephone Encounter
-----   Message from Ginger Organ, APRN sent at 09/10/2017  1:14 PM CST -----  Will you please let Mr. Rathbone know that his kidney function looked improved? Blood glucose very elevated at 377. Please encouraged him to follow up with PCP next week for adjustment in his insulin. Let's continue the current diuretic plan and ask him to do his best to follow a low sodium diet and moderate fluid restriction. Thanks, Sharee Pimple

## 2017-09-10 NOTE — Progress Notes
Date of Service: 09/10/2017    Larry Pena is a 56 y.o. male.       HPI    I had the pleasure of seeing Larry Pena today for heart failure follow up. 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 His most recent echo 05/2017 showed LVEF 25% with???LVIDD 6.8 cm. ???    Larry Pena reports that he has had bronchitis and has been coughing a lot. He has taken 2 courses of antibiotics and finished the last one 10-12 days ago. He still has a cough. He has some chest discomfort with coughing.  He tells me that his leg edema is improved. His weight is 300 pounds today.  He weighed 299 pounds when he saw Larry Pena on 10/16.  His weight was 292 on his home scale this am. It was in the range of 281-282 a couple of months ago. He denies lightheadedness, dizziness, syncope, presyncope and palpitations.  He tries to adhere to a low-sodium diet.  He is having some difficulties swallowing his pills and needs to drink a lot of water to get his pills down.  He is supposed to be seeing a gastroenterologist and undergoing upper endoscopy and possible esophageal dilation in the near future. His blood sugars have been elevated.  He has an appt. on 10/05/17 in the diabetes clinic here for management of his DM. His BG was 204 this am and he reports they have been running in that range.     He currently is taking torsemide 100 mg twice daily, Spironolactone 25 mg daily and metolazone 2.5 mg when instructed to take it. A BMP on 11/27 showed that his creatinine was up to 3.01 from 2.09 on 10/16.  BUN had risen from 33 to 61.  Sodium was 137, potassium 3.4 and chloride 87.  Anion gap was 20.  Blood glucose was 351.  He was advised to hold metolazone.  His PAD readings have been elevated up in the 30's.  His goal PAD was 27-28, but was adjusted to keep less than 30 until he was re-evaluated in clinic. On 12/3, his PAD was 36, 12/4 37, 12/5 35, 12/6 34 and 12/7 33 - slightly trending down over the previous several days.     BMP today showed improved creatinine 2.17, BUN 30, sodium 135, potassium 4.9, chloride 95, CO2 34 and blood glucose 377.        Vitals:    09/10/17 1002   BP: 120/70   Pulse: 86   Weight: 136.1 kg (300 lb)   Height: 1.829 m (6')     Body mass index is 40.69 kg/m???.     Past Medical History  Patient Active Problem List    Diagnosis Date Noted   ??? CardioMEMS pulmonary artery sensor  06/21/2017   ??? Paroxysmal atrial fibrillation (HCC) 05/27/2017     05/27/17 on Eliquis 5 mg bid      ??? Cellulitis of right lower limb 05/27/2017   ??? Chronic kidney disease, stage 3 (moderate) (HCC) 05/27/2017   ??? Depression 05/27/2017   ??? Diabetic polyneuropathy associated with type 2 diabetes mellitus (HCC) 05/27/2017   ??? Pain of foot 05/27/2017   ??? History of MI (myocardial infarction) 05/27/2017   ??? Low back pain 05/27/2017   ???  Radiculopathy, lumbar region 05/27/2017   ??? Acute on chronic systolic (congestive) heart failure (HCC) 05/25/2017   ??? Dilated cardiomyopathy (HCC) 12/04/2015   ??? Community acquired pneumonia 08/06/2015   ??? Achalasia of esophagus 06/21/2015   ??? Cardiac resynchronization therapy defibrillator (CRT-D) in place 06/21/2015     Placed in January, 2016    Medtronic DDD-ICD     ??? Type 2 diabetes mellitus with foot ulcer (CODE) (HCC) 06/18/2015   ??? Dyslipidemia 06/18/2015   ??? Hypertension 06/18/2015   ??? Diabetic neuropathy (HCC) 06/18/2015   ??? Syncope 06/18/2015   ??? Chronic systolic heart failure (HCC) 06/18/2015     cardiomems placement June 2016  Basline PA 55/33    ECHO 04/12/2014: LVEF 50-55%  Mildly dilated LV, normal wall motion, grade 2 diastolic dysfunction, L atrium is mildly dilated, mild aortic regurgitation, mildly dilated aortic root     Cardiac Cath: 04/18/2013: L Heart Cath/L Ventriculography/Selective coronary angiography  -normal coronary arteries, LV is dilated and has reduced EF of 41%    03/05/2015: R Heart Cath, Pulmonary angiography, placement of cardioMEM   -CI Fick 2.1, CO Fick 5., PA Sat 60.5, RV 60/16, PA 67/34, PCW 17         Review of Systems   Constitution: Negative.   HENT: Negative.    Eyes: Negative.    Cardiovascular: Negative.    Respiratory: Negative.    Endocrine: Negative.    Hematologic/Lymphatic: Negative.    Skin: Negative.    Musculoskeletal: Negative.    Gastrointestinal: Negative.    Genitourinary: Negative.    Neurological: Negative.    Psychiatric/Behavioral: Negative.    Allergic/Immunologic: Negative.      Physical Exam  General Appearance: no acute distress  Skin: warm & intact  HEENT: unremarkable  Neck Veins: CVP difficult to assess  Chest Inspection: chest is normal in appearance  Auscultation/Percussion: lungs clear to auscultation, no rales, rhonchi, or wheezing  Cardiac Rhythm: regular rhythm & normal rate  Cardiac Auscultation: Normal S1 & S2, no S3 or S4, no rub  Murmurs: no cardiac murmurs   Extremities: 1+ lower extremity edema; 2+ symmetric distal pulses  Abdominal Exam: soft, non-tender, bowel sounds normal  Neurologic Exam: oriented to time, place and person; no focal neurologic deficits  Psychiatric: Normal mood and affect.  Behavior is normal. Judgment and thought content normal.     Cardiovascular Studies  ECG: AV paced rhythm.     Problems Addressed Today  Encounter Diagnoses   Name Primary?   ??? Chronic systolic heart failure (HCC) Yes   ??? Dilated cardiomyopathy (HCC)    ??? Essential hypertension    ??? Paroxysmal atrial fibrillation (HCC)    ??? Systolic heart failure, unspecified HF chronicity (HCC)    ??? Type 2 diabetes mellitus with foot ulcer (CODE) (HCC)    ??? Cardiac resynchronization therapy defibrillator (CRT-D) in place    ??? Chronic kidney disease, stage 3 (moderate) (HCC)      Assessment and Plan 1. Chronic combined???HFrEF???NYHA Class III, stage???C. CardioMEMS PAD  is slowly trending down over the previous several days, although still elevated at 33. He likely has some volume loss/depletion related to hyperglycemia. I have asked to follow up with his PCP regarding adjustments in his insulin dose. He will continue torsemide 100 mg BID, spironolactone 25 mg daily, and metolazone when instructed. His SCr is improved today. We will need to follow him closely. He has difficulty following a fluid restriction due to possible esophageal stricture/difficulty swallowing his pills  without quite a bit of water. He reports that he will be seeing a gastroenterologist for evaluation in the near future. I am not going to make any changes today, since he is fairly stable clinically. We will avoid adding an ACEi/ARB/ARNI due to CKD. We may want to consider hydralazine/nitrates in the future. BP is excellent today. I have asked him to follow up with an APP in the heart failure clinic in 2 weeks. .   2. Non-ischemic???cardiomyopathy.  Echo on 8/21 showed LVEF 25%, LV 6.8cm and mild-mod AI with moderate dilation of ascending aorta. Prior echo in 11/2015 showed EF 15-20% with grade I diastolic dysfunction. He is currently on suboptimal???neurohormonal antagonist (NHA). He will continue carvedilol 3.125 mg bid and spironolactone 25 mg daily. Lisinopril was stopped a couple of months ago due to AKI.    3. CKD - SCr improved on BMP today. .   4. Hypertension. Blood pressure looks good today.   5. CRT-D, Medtronic. upgraded to CRT-D summer of 2017 per [facility] cardiology at [facility].    6. Atrial arrhythmias.  Has had Cardioversion at [facility] back in 2017.  He will continue chronic anticoagulation???with eliquis 5 mg bid and Amiodarone 200mg  daily.  His LFTs & thyroid studies were???normal on 9/4. CXR and PFT also have been performed.            Current Medications (including today's revisions) ??? amiodarone (CORDARONE) 200 mg tablet Take one tablet by mouth daily. Take with food.   ??? apixaban (ELIQUIS) 5 mg tablet Take 5 mg by mouth twice daily.   ??? aspirin EC 81 mg tablet Take 81 mg by mouth daily. Take with food.    ??? BD ULTRA-FINE SHORT PEN NEEDLE 31 gauge x 5/16 pen needle Use as directed   ??? Blood Pressure Monitor (BLOOD PRESSURE KIT) kit Use as directed   ??? buPROPion SR(+) (WELLBUTRIN, ZYBAN) 200 mg tablet Take 200 mg by mouth daily.   ??? carvedilol (COREG) 3.125 mg tablet Take 3.125 mg by mouth twice daily with meals. Take with food.    ??? HYDROcodone/acetaminophen(+) (NORCO) 10/325 mg tablet Take 1 tablet by mouth every 6 hours as needed for Pain   ??? insulin glargine (TOUJEO SOLOSTAR) 300 unit/mL (1.5 mL) injectable Inject 35 Units under the skin at bedtime daily.   ??? metOLazone (ZAROXOLYN) 2.5 mg tablet Take one tablet once today only. Do not take remaining tabs until ordered by provider   ??? nitroglycerin (NITROSTAT) 0.4 mg tablet Place 0.4 mg under tongue every 5 minutes as needed for Chest Pain. Max of 3 tablets, call 911.    ??? NOVOLOG FLEXPEN U-100 INSULIN 100 unit/mL injection PEN Inject 15 Units under the skin three times daily with meals.   ??? omeprazole DR(+) (PRILOSEC) 40 mg capsule Take 40 mg by mouth daily before breakfast.   ??? potassium chloride (K-DUR) 10 mEq tablet Take four tablets by mouth twice daily. Take with a meal and a full glass of water.   ??? pregabalin (LYRICA) 150 mg capsule Take 150 mg by mouth three times daily.   ??? simvastatin (ZOCOR) 20 mg tablet Take 20 mg by mouth at bedtime daily.   ??? spironolactone (ALDACTONE) 25 mg tablet Take one tablet by mouth daily. Take with food.   ??? torsemide(+) (DEMADEX) 100 mg tablet Take one tablet by mouth twice daily.   ??? TRUE METRIX GLUCOSE TEST STRIP test strip Use as directed   ??? UNILET LANCET 33 gauge Use as directed

## 2017-09-10 NOTE — Telephone Encounter
I called pt with his lab results. He will f/u regarding his elevated blood sugar. He asked me to transfer him to scheduling to set up his appt for next week in HF.

## 2017-09-13 ENCOUNTER — Encounter: Admit: 2017-09-13 | Discharge: 2017-09-13 | Payer: MEDICAID

## 2017-09-13 DIAGNOSIS — I5022 Chronic systolic (congestive) heart failure: Principal | ICD-10-CM

## 2017-09-13 NOTE — Telephone Encounter
PAD elevated on CardioMEMS.  Patient saw Ginger Organ in Cardiology Clinic on 09/10/17--labs were stable, no medication changes made.    RECENT LABS:    Basic Metabolic Profile    Lab Results   Component Value Date/Time    NA 135 (L) 09/10/2017 09:55 AM    K 4.9 09/10/2017 09:55 AM    CA 9.2 09/10/2017 09:55 AM    CL 95 (L) 09/10/2017 09:55 AM    CO2 34 (H) 09/10/2017 09:55 AM    GAP 6 09/10/2017 09:55 AM    Lab Results   Component Value Date/Time    BUN 30 (H) 09/10/2017 09:55 AM    CR 2.17 (H) 09/10/2017 09:55 AM    GLU 377 (H) 09/10/2017 09:55 AM          Daily CardioMEMS Readings    Goal PA Diastolic: 01-09 mmHg  PA Diastolic Thresholds: 32-35 mmHg  Taken on PA Systolic PA Diastolic PA Mean Heart Rate   09-13-2017, 10:39 AM 57 mmHg 33 mmHg 41 mmHg 80 bpm   09-10-2017, 06:40 AM 57 mmHg 33 mmHg 41 mmHg 70 bpm   09-09-2017, 09:47 AM 65 mmHg 34 mmHg 45 mmHg 76 bpm   09-08-2017, 06:45 AM 59 mmHg 35 mmHg 43 mmHg 72 bpm   09-07-2017, 04:26 PM 67 mmHg 37 mmHg 48 mmHg 73 bpm   09-06-2017, 08:48 AM 63 mmHg 36 mmHg 45 mmHg 70 bpm   09-02-2017, 08:32 AM 57 mmHg 31 mmHg 40 mmHg 71 bpm   09-01-2017, 11:10 AM 51 mmHg 30 mmHg 37 mmHg 71 bpm   08-31-2017, 02:18 PM 56 mmHg 30 mmHg 40 mmHg 73 bpm   08-30-2017, 10:03 AM 51 mmHg 29 mmHg 37 mmHg 75 bpm           Reviewed with Dr. Linard Millers, V.O. Given:    Instruct patient to take metolazone 2.5mg  x1 dose tomorrow morning.  BMP at follow up appt on 10/07/17--check BMP sooner if patient requires additional doses of metolazone prior to follow up on 1/3.       Patient verbalizes understanding of instructions.

## 2017-09-17 ENCOUNTER — Encounter: Admit: 2017-09-17 | Discharge: 2017-09-17 | Payer: Medicaid Other

## 2017-09-17 NOTE — Telephone Encounter
PAD 32 on CardioMEMS today. Last dose of metolazone was 09/14/18. PAD 32, down from 36 on 12/11 after patient took the metolazone.     Daily CardioMEMS Readings    Goal PA Diastolic: 09-62 mmHg  PA Diastolic Thresholds: 83-66QHUT  Taken on PA Systolic PA Diastolic PA Mean Heart Rate   09-17-2017, 10:42 AM 53 mmHg 32 mmHg 39 mmHg 73 bpm   09-15-2017, 11:30 AM 59 mmHg 32 mmHg 42 mmHg 80 bpm   09-14-2017, 09:49 AM 64 mmHg 36 mmHg 45 mmHg 74 bpm   09-13-2017, 10:39 AM 57 mmHg 33 mmHg 41 mmHg 80 bpm   09-10-2017, 06:40 AM 57 mmHg 33 mmHg 41 mmHg 70 bpm   09-09-2017, 09:47 AM 65 mmHg 34 mmHg 45 mmHg 76 bpm   09-08-2017, 06:45 AM 59 mmHg 35 mmHg 43 mmHg 72 bpm   09-07-2017, 04:26 PM 67 mmHg 37 mmHg 48 mmHg 73 bpm   09-06-2017, 08:48 AM 63 mmHg 36 mmHg 45 mmHg 70 bpm   09-02-2017, 08:32 AM 57 mmHg 31 mmHg 40 mmHg 71 bpm       CARDIAC MEDS:      metOLazone (ZAROXOLYN) 2.5 mg PRN as directed by CardioMEMS/HF team    potassium chloride (K-DUR) 40 mEq  by mouth twice daily.     spironolactone (ALDACTONE) 25 mg daily    torsemide(+) (DEMADEX) 100 mg BID     amiodarone (CORDARONE) 200 mg dalily     carvedilol (COREG)3.125 mg BID with meals.       RECENT LABS:    Basic Metabolic Profile    Lab Results   Component Value Date/Time    NA 135 (L) 09/10/2017 09:55 AM    K 4.9 09/10/2017 09:55 AM    CA 9.2 09/10/2017 09:55 AM    CL 95 (L) 09/10/2017 09:55 AM    CO2 34 (H) 09/10/2017 09:55 AM    GAP 6 09/10/2017 09:55 AM    Lab Results   Component Value Date/Time    BUN 30 (H) 09/10/2017 09:55 AM    CR 2.17 (H) 09/10/2017 09:55 AM    GLU 377 (H) 09/10/2017 09:55 AM          Routing to Dr. Lyndel Safe for review and further recommendations.

## 2017-09-23 NOTE — Telephone Encounter
LATE EXBMW--41/32/44:      PA Diastolic 01UUVO on CardioMEMS today.     V.O. From Dr. Lyndel Safe to instruct patient to take metolazone 2.5mg  with additional KCl 53meq this afternoon.

## 2017-09-24 ENCOUNTER — Ambulatory Visit: Admit: 2017-09-24 | Discharge: 2017-09-25 | Payer: MEDICAID

## 2017-09-24 ENCOUNTER — Encounter: Admit: 2017-09-24 | Discharge: 2017-09-24 | Payer: MEDICAID

## 2017-09-24 DIAGNOSIS — I5022 Chronic systolic (congestive) heart failure: Principal | ICD-10-CM

## 2017-09-25 DIAGNOSIS — Z95810 Presence of automatic (implantable) cardiac defibrillator: ICD-10-CM

## 2017-09-30 ENCOUNTER — Encounter: Admit: 2017-09-30 | Discharge: 2017-09-30 | Payer: Medicaid Other

## 2017-09-30 ENCOUNTER — Encounter: Admit: 2017-09-30 | Discharge: 2017-09-30 | Payer: MEDICAID

## 2017-09-30 DIAGNOSIS — I5022 Chronic systolic (congestive) heart failure: Principal | ICD-10-CM

## 2017-09-30 NOTE — Telephone Encounter
Pt reports he has had bronchitis for a couple months now. He reports he was given antibiotics by his PCP (will request records).    CardioMEMS Coordinator spoke to patient on 12/24 and instructed patient to go to PCP asap to ensure he does not have PNA.       CardioMEMS Coordinator spoke to patient again this afternoon. Patient was audibly wheezing during telephone conversation. Denies any recent fevers/chills. He reports his CXR was negative for PNA today. He reports PCP again told him he has bronchitis & needs to continue to take his allergy meds. Pt reports he does not have any allergies that he is aware of.     Last dose of metolazone was on 12/19.     Daily CardioMEMS Readings  Goal PA Diastolic: 27-28 mmHg  PA Diastolic Thresholds: 25-9mmHg  Taken on PA Systolic PA Diastolic PA Mean Heart Rate   [id number], 11:08 AM 46 mmHg 24 mmHg 31 mmHg 70 bpm   09-27-2017, 08:05 AM 53 mmHg 32 mmHg 39 mmHg 71 bpm   09-24-2017, 08:40 AM 57 mmHg 35 mmHg 42 mmHg 83 bpm   09-23-2017, 10:35 AM 57 mmHg 33 mmHg 41 mmHg 73 bpm   09-22-2017, 12:41 PM 63 mmHg 40 mmHg 48 mmHg 70 bpm   09-21-2017, 03:01 PM 65 mmHg 37 mmHg 46 mmHg 70 bpm   09-20-2017, 02:52 PM 59 mmHg 35 mmHg 43 mmHg 70 bpm   09-17-2017, 10:42 AM 53 mmHg 32 mmHg 39 mmHg 73 bpm   09-15-2017, 11:30 AM 59 mmHg 32 mmHg 42 mmHg 80 bpm   09-14-2017, 09:49 AM 64 mmHg 36 mmHg 45 mmHg 74 bpm     CARDIAC MEDS:  ???  metOLazone (ZAROXOLYN) 2.5 mg PRN as directed by CardioMEMS/HF team  ???  potassium chloride (K-DUR) 40 mEq  by mouth twice daily.   ???  spironolactone (ALDACTONE) 25 mg daily  ???  torsemide(+) (DEMADEX) 100 mg BID   ???  amiodarone (CORDARONE) 200 mg dalily   ???  carvedilol (COREG)3.125 mg BID with meals.       Reviewed encounter with Dr. Chales Abrahams. Orders from Dr. Chales Abrahams:  1.) Please schedule patient to see HF provider in clinic.   2.) 2D Echo + Doppler      Patient states he is unable to come to [place] for appt tomorrow. Patient lives 1.5 hrs away. He states he can come for appts 1/2, 1/3, or 1/4. CardioMEMS Coordinator will call patient on 12/28 with appointment times.

## 2017-09-30 NOTE — Telephone Encounter
-----   Message from Leslie Steinkamp, RN sent at 09/30/2017 11:28 AM CST -----  Derek or Cathy,    If you have time, can you please call patient and ask to send cardiomems readings?     Thank You!!  Leslie

## 2017-09-30 NOTE — Telephone Encounter
Cal placed to pt to request he send mems reading today. Left detailed message on authorized VM requesting he send reading or cb if any concerns.

## 2017-10-06 ENCOUNTER — Encounter: Admit: 2017-10-06 | Discharge: 2017-10-06 | Payer: MEDICAID

## 2017-10-06 ENCOUNTER — Encounter: Admit: 2017-10-06 | Discharge: 2017-10-06 | Payer: Medicaid Other

## 2017-10-06 DIAGNOSIS — J189 Pneumonia, unspecified organism: Secondary | ICD-10-CM

## 2017-10-06 DIAGNOSIS — I890 Lymphedema, not elsewhere classified: ICD-10-CM

## 2017-10-06 DIAGNOSIS — J9601 Acute respiratory failure with hypoxia: Secondary | ICD-10-CM

## 2017-10-06 DIAGNOSIS — K220 Achalasia of cardia: Secondary | ICD-10-CM

## 2017-10-06 LAB — INFLUENZA A/B AG (RAPID TEST): Lab: NEGATIVE g/dL (ref 6.0–8.0)

## 2017-10-06 LAB — CBC AND DIFF
Lab: 0 10*3/uL (ref 0–0.20)
Lab: 0.1 10*3/uL (ref 0–0.45)
Lab: 12 10*3/uL — ABNORMAL HIGH (ref 4.5–11.0)

## 2017-10-06 LAB — BNP POC ER: Lab: 143 pg/mL — ABNORMAL HIGH (ref 0–100)

## 2017-10-06 LAB — D-DIMER: Lab: 465 ng{FEU}/mL — ABNORMAL HIGH (ref <500)

## 2017-10-06 LAB — POC TROPONIN: Lab: 0 ng/mL — ABNORMAL HIGH (ref 0.00–0.05)

## 2017-10-06 LAB — LACTIC ACID (BG - RAPID LACTATE): Lab: 1.8 MMOL/L (ref 0.5–2.0)

## 2017-10-06 MED ORDER — INSULIN ASPART 100 UNIT/ML SC FLEXPEN
0-7 [IU] | Freq: Before meals | SUBCUTANEOUS | 0 refills | Status: DC
Start: 2017-10-06 — End: 2017-10-13
  Administered 2017-10-07: 05:00:00 3 [IU] via SUBCUTANEOUS

## 2017-10-06 MED ORDER — ASPIRIN 81 MG PO CHEW
324 mg | Freq: Once | ORAL | 0 refills | Status: CP
Start: 2017-10-06 — End: ?
  Administered 2017-10-07: 01:00:00 324 mg via ORAL

## 2017-10-06 MED ORDER — BENZONATATE 100 MG PO CAP
100 mg | Freq: Three times a day (TID) | ORAL | 0 refills | Status: DC | PRN
Start: 2017-10-06 — End: 2017-10-13
  Administered 2017-10-07 – 2017-10-12 (×10): 100 mg via ORAL

## 2017-10-06 MED ORDER — AMIODARONE 200 MG PO TAB
200 mg | Freq: Every day | ORAL | 0 refills | Status: DC
Start: 2017-10-06 — End: 2017-10-13
  Administered 2017-10-07 – 2017-10-12 (×5): 200 mg via ORAL

## 2017-10-06 MED ORDER — BENZONATATE 100 MG PO CAP
100 mg | Freq: Once | ORAL | 0 refills | Status: CP
Start: 2017-10-06 — End: ?
  Administered 2017-10-07: 02:00:00 100 mg via ORAL

## 2017-10-06 MED ORDER — PREGABALIN 75 MG PO CAP
150 mg | Freq: Three times a day (TID) | ORAL | 0 refills | Status: DC
Start: 2017-10-06 — End: 2017-10-13
  Administered 2017-10-07 – 2017-10-12 (×15): 150 mg via ORAL

## 2017-10-06 MED ORDER — PANTOPRAZOLE 40 MG PO TBEC
80 mg | Freq: Every day | ORAL | 0 refills | Status: DC
Start: 2017-10-06 — End: 2017-10-13
  Administered 2017-10-07 – 2017-10-12 (×6): 80 mg via ORAL

## 2017-10-06 MED ORDER — DOXYCYCLINE HYCLATE 100 MG PO TAB
100 mg | Freq: Two times a day (BID) | ORAL | 0 refills | Status: DC
Start: 2017-10-06 — End: 2017-10-10
  Administered 2017-10-07 – 2017-10-10 (×7): 100 mg via ORAL

## 2017-10-06 MED ORDER — CARVEDILOL 3.125 MG PO TAB
3.125 mg | Freq: Two times a day (BID) | ORAL | 0 refills | Status: DC
Start: 2017-10-06 — End: 2017-10-13
  Administered 2017-10-07 – 2017-10-11 (×8): 3.125 mg via ORAL

## 2017-10-06 MED ORDER — SIMVASTATIN 20 MG PO TAB
20 mg | Freq: Every evening | ORAL | 0 refills | Status: DC
Start: 2017-10-06 — End: 2017-10-13
  Administered 2017-10-07 – 2017-10-12 (×6): 20 mg via ORAL

## 2017-10-06 MED ORDER — INSULIN GLARGINE 100 UNIT/ML (3 ML) SC INJ PEN
35 [IU] | Freq: Every evening | SUBCUTANEOUS | 0 refills | Status: DC
Start: 2017-10-06 — End: 2017-10-08
  Administered 2017-10-07: 05:00:00 35 [IU] via SUBCUTANEOUS

## 2017-10-06 MED ORDER — SPIRONOLACTONE 25 MG PO TAB
25 mg | Freq: Every day | ORAL | 0 refills | Status: DC
Start: 2017-10-06 — End: 2017-10-13
  Administered 2017-10-07 – 2017-10-12 (×5): 25 mg via ORAL

## 2017-10-06 MED ORDER — HYDROCODONE-ACETAMINOPHEN 10-300 MG PO TAB
1 | ORAL | 0 refills | Status: DC | PRN
Start: 2017-10-06 — End: 2017-10-13
  Administered 2017-10-07 – 2017-10-11 (×8): 1 via ORAL

## 2017-10-06 MED ORDER — IOHEXOL 350 MG IODINE/ML IV SOLN
80 mL | Freq: Once | INTRAVENOUS | 0 refills | Status: CP
Start: 2017-10-06 — End: ?
  Administered 2017-10-07: 01:00:00 80 mL via INTRAVENOUS

## 2017-10-06 MED ORDER — FUROSEMIDE 10 MG/ML IJ SOLN
80 mg | Freq: Three times a day (TID) | INTRAVENOUS | 0 refills | Status: DC
Start: 2017-10-06 — End: 2017-10-11
  Administered 2017-10-07 – 2017-10-11 (×13): 80 mg via INTRAVENOUS

## 2017-10-06 MED ORDER — CEFTRIAXONE INJ 1GM IVP
1 g | Freq: Once | INTRAVENOUS | 0 refills | Status: CP
Start: 2017-10-06 — End: ?
  Administered 2017-10-07: 04:00:00 1 g via INTRAVENOUS

## 2017-10-06 MED ORDER — BUPROPION XL 150 MG PO TB24
450 mg | Freq: Every day | ORAL | 0 refills | Status: DC
Start: 2017-10-06 — End: 2017-10-07

## 2017-10-06 MED ORDER — ASPIRIN 81 MG PO TBEC
81 mg | Freq: Every day | ORAL | 0 refills | Status: DC
Start: 2017-10-06 — End: 2017-10-13
  Administered 2017-10-07 – 2017-10-12 (×5): 81 mg via ORAL

## 2017-10-06 MED ORDER — INSULIN ASPART 100 UNIT/ML SC FLEXPEN
15 [IU] | Freq: Three times a day (TID) | SUBCUTANEOUS | 0 refills | Status: DC
Start: 2017-10-06 — End: 2017-10-13
  Administered 2017-10-12: 15 [IU] via SUBCUTANEOUS

## 2017-10-06 MED ORDER — CEFTRIAXONE INJ 1GM IVP
1 g | INTRAVENOUS | 0 refills | Status: DC
Start: 2017-10-06 — End: 2017-10-13
  Administered 2017-10-08 – 2017-10-12 (×5): 1 g via INTRAVENOUS

## 2017-10-06 MED ORDER — ONDANSETRON HCL (PF) 4 MG/2 ML IJ SOLN
4 mg | INTRAVENOUS | 0 refills | Status: DC | PRN
Start: 2017-10-06 — End: 2017-10-13
  Administered 2017-10-08 – 2017-10-09 (×3): 4 mg via INTRAVENOUS

## 2017-10-06 MED ORDER — DOXYCYCLINE 100MG IVPB
100 mg | Freq: Once | INTRAVENOUS | 0 refills | Status: CP
Start: 2017-10-06 — End: ?
  Administered 2017-10-07 (×2): 100 mg via INTRAVENOUS

## 2017-10-06 MED ORDER — APIXABAN 5 MG PO TAB
5 mg | Freq: Two times a day (BID) | ORAL | 0 refills | Status: CP
Start: 2017-10-06 — End: ?
  Administered 2017-10-07 – 2017-10-10 (×7): 5 mg via ORAL

## 2017-10-06 MED ORDER — SODIUM CHLORIDE 0.9 % IJ SOLN
50 mL | Freq: Once | INTRAVENOUS | 0 refills | Status: CP
Start: 2017-10-06 — End: ?
  Administered 2017-10-07: 01:00:00 50 mL via INTRAVENOUS

## 2017-10-07 LAB — MAGNESIUM
Lab: 2.2 mg/dL — ABNORMAL HIGH (ref 1.6–2.6)
Lab: 2 mg/dL — ABNORMAL HIGH (ref >60)

## 2017-10-07 LAB — COMPREHENSIVE METABOLIC PANEL
Lab: 133 MMOL/L — ABNORMAL LOW (ref 137–147)
Lab: 27 mL/min — ABNORMAL LOW (ref >60)
Lab: 3.7 g/dL (ref 3.5–5.0)
Lab: 33 mL/min — ABNORMAL LOW (ref >60)
Lab: 37 U/L (ref 7–56)
Lab: 4.4 MMOL/L — ABNORMAL LOW (ref 3.5–5.1)
Lab: 52 U/L — ABNORMAL HIGH (ref 7–40)
Lab: 94 MMOL/L — ABNORMAL LOW (ref 98–110)

## 2017-10-07 LAB — LEGIONELLA ANTIGEN URINE,RAN: Lab: NEGATIVE

## 2017-10-07 LAB — TROPONIN-I
Lab: 0 ng/mL — ABNORMAL HIGH (ref 0.0–0.05)
Lab: 0 ng/mL — ABNORMAL HIGH (ref 0.0–0.05)
Lab: 0 ng/mL — ABNORMAL HIGH (ref 0.0–0.05)

## 2017-10-07 LAB — CBC
Lab: 11 g/dL — ABNORMAL LOW (ref >60)
Lab: 14 10*3/uL — ABNORMAL HIGH (ref >60)
Lab: 4 M/UL — ABNORMAL LOW (ref >60)

## 2017-10-07 LAB — GRAM STAIN

## 2017-10-07 LAB — BASIC METABOLIC PANEL
Lab: 135 MMOL/L — ABNORMAL LOW (ref >60)
Lab: 4.3 MMOL/L (ref 3.5–5.1)
Lab: 136 MMOL/L — ABNORMAL LOW (ref 137–147)

## 2017-10-07 LAB — POC TROPONIN: Lab: 0 ng/mL — ABNORMAL HIGH (ref 0.00–0.05)

## 2017-10-07 LAB — POC GLUCOSE
Lab: 270 mg/dL — ABNORMAL HIGH (ref 70–100)
Lab: 268 mg/dL — ABNORMAL HIGH (ref 70–100)
Lab: 158 mg/dL — ABNORMAL HIGH (ref 70–100)

## 2017-10-07 LAB — SCL 70 ANTIBODIES

## 2017-10-07 LAB — STREPTOCOCCUS PNEUMO AG, URINE

## 2017-10-07 LAB — CREATINE KINASE-CPK: Lab: 100 U/L — ABNORMAL HIGH (ref 35–232)

## 2017-10-07 LAB — CENTROMERE ANTIBODIES

## 2017-10-07 MED ORDER — BUPROPION XL 150 MG PO TB24
150 mg | Freq: Every day | ORAL | 0 refills | Status: DC
Start: 2017-10-07 — End: 2017-10-13
  Administered 2017-10-07 – 2017-10-12 (×5): 150 mg via ORAL

## 2017-10-07 MED ORDER — CHLOROTHIAZIDE SODIUM 500 MG IV SOLR
250 mg | Freq: Once | INTRAVENOUS | 0 refills | Status: CP
Start: 2017-10-07 — End: ?
  Administered 2017-10-07: 22:00:00 250 mg via INTRAVENOUS

## 2017-10-07 MED ORDER — ALBUTEROL SULFATE 2.5 MG /3 ML (0.083 %) IN NEBU
2.5 mg | RESPIRATORY_TRACT | 0 refills | Status: DC | PRN
Start: 2017-10-07 — End: 2017-10-13
  Administered 2017-10-11: 04:00:00 2.5 mg via RESPIRATORY_TRACT

## 2017-10-07 MED ORDER — PNEUMOCOCCAL 23-VAL PS VACCINE 25 MCG/0.5 ML IJ SOLN
.5 mL | Freq: Once | INTRAMUSCULAR | 0 refills | Status: DC
Start: 2017-10-07 — End: 2017-10-07

## 2017-10-07 MED ADMIN — ALBUTEROL SULFATE 2.5 MG /3 ML (0.083 %) IN NEBU [250]: 2.5 mg | RESPIRATORY_TRACT | @ 07:00:00 | Stop: 2017-10-07 | NDC 04879050101

## 2017-10-08 LAB — RVP VIRAL PANEL PCR: Lab: DETECTED

## 2017-10-08 LAB — POC GLUCOSE
Lab: 128 mg/dL — ABNORMAL HIGH (ref 70–100)
Lab: 166 mg/dL — ABNORMAL HIGH (ref 70–100)
Lab: 189 mg/dL — ABNORMAL HIGH (ref 70–100)
Lab: 205 mg/dL — ABNORMAL HIGH (ref 70–100)
Lab: 208 mg/dL — ABNORMAL HIGH (ref 70–100)

## 2017-10-08 LAB — MAGNESIUM
Lab: 2.2 mg/dL — ABNORMAL LOW (ref 1.6–2.6)
Lab: 2.1 mg/dL — ABNORMAL LOW (ref >60)

## 2017-10-08 LAB — BASIC METABOLIC PANEL
Lab: 135 MMOL/L — ABNORMAL LOW (ref 137–147)
Lab: 138 MMOL/L — ABNORMAL HIGH (ref >60)

## 2017-10-08 LAB — ANTI-NUCLEAR AB(ANA)-QUANT: Lab: 320 — ABNORMAL HIGH (ref <80)

## 2017-10-08 LAB — ANTI-NUCLEAR ANTIBODY(ANA)

## 2017-10-08 LAB — CBC: Lab: 9.2 10*3/uL — ABNORMAL LOW (ref 4.5–11.0)

## 2017-10-08 MED ORDER — APIXABAN 5 MG PO TAB
5 mg | Freq: Two times a day (BID) | ORAL | 0 refills | Status: DC
Start: 2017-10-08 — End: 2017-10-11

## 2017-10-08 MED ORDER — POTASSIUM CHLORIDE 20 MEQ PO TBTQ
40 meq | Freq: Two times a day (BID) | ORAL | 0 refills | Status: DC
Start: 2017-10-08 — End: 2017-10-13
  Administered 2017-10-08 – 2017-10-11 (×6): 40 meq via ORAL

## 2017-10-08 MED ORDER — INSULIN GLARGINE 100 UNIT/ML (3 ML) SC INJ PEN
40 [IU] | Freq: Every evening | SUBCUTANEOUS | 0 refills | Status: DC
Start: 2017-10-08 — End: 2017-10-13

## 2017-10-08 MED ORDER — CODEINE-GUAIFENESIN 10-100 MG/5 ML PO LIQD
5 mL | Freq: Once | ORAL | 0 refills | Status: CP
Start: 2017-10-08 — End: ?
  Administered 2017-10-08: 13:00:00 5 mL via ORAL

## 2017-10-08 MED ORDER — MELATONIN 5 MG PO TAB
5 mg | Freq: Every evening | ORAL | 0 refills | Status: DC
Start: 2017-10-08 — End: 2017-10-13
  Administered 2017-10-09 – 2017-10-12 (×4): 5 mg via ORAL

## 2017-10-08 MED ORDER — CHLOROTHIAZIDE SODIUM 500 MG IV SOLR
250 mg | Freq: Two times a day (BID) | INTRAVENOUS | 0 refills | Status: AC
Start: 2017-10-08 — End: ?
  Administered 2017-10-08 – 2017-10-10 (×4): 250 mg via INTRAVENOUS

## 2017-10-08 MED ORDER — BENZOCAINE-MENTHOL 6-10 MG MM LOZG
1 | ORAL | 0 refills | Status: DC | PRN
Start: 2017-10-08 — End: 2017-10-13
  Administered 2017-10-08: 10:00:00 1 via ORAL

## 2017-10-08 MED ORDER — GUAIFENESIN 600 MG PO TA12
600 mg | Freq: Two times a day (BID) | ORAL | 0 refills | Status: DC
Start: 2017-10-08 — End: 2017-10-13
  Administered 2017-10-08 – 2017-10-12 (×7): 600 mg via ORAL

## 2017-10-08 MED ORDER — PERFLUTREN LIPID MICROSPHERES 1.1 MG/ML IV SUSP
1-20 mL | Freq: Once | INTRAVENOUS | 0 refills | Status: CP
Start: 2017-10-08 — End: ?
  Administered 2017-10-08: 22:00:00 2 mL via INTRAVENOUS

## 2017-10-08 MED ORDER — POTASSIUM CHLORIDE IN WATER 10 MEQ/50 ML IV PGBK
10 meq | INTRAVENOUS | 0 refills | Status: CP
Start: 2017-10-08 — End: ?
  Administered 2017-10-08 (×4): 10 meq via INTRAVENOUS

## 2017-10-08 MED ORDER — CHLOROTHIAZIDE SODIUM 500 MG IV SOLR
250 mg | Freq: Once | INTRAVENOUS | 0 refills | Status: CP
Start: 2017-10-08 — End: ?
  Administered 2017-10-08: 18:00:00 250 mg via INTRAVENOUS

## 2017-10-08 MED ADMIN — SODIUM CHLORIDE 0.9 % IV SOLP [27838]: 1000 mL | INTRAVENOUS | Stop: 2017-10-08 | NDC 00338004904

## 2017-10-09 LAB — MAGNESIUM
Lab: 2.1 mg/dL — ABNORMAL LOW (ref 1.6–2.6)
Lab: 2.2 mg/dL — ABNORMAL HIGH (ref 1.6–2.6)

## 2017-10-09 LAB — BASIC METABOLIC PANEL
Lab: 137 MMOL/L (ref 137–147)
Lab: 2.4 mg/dL — ABNORMAL HIGH (ref 0.4–1.24)
Lab: 138 MMOL/L — ABNORMAL HIGH (ref 137–147)

## 2017-10-09 LAB — POC GLUCOSE
Lab: 158 mg/dL — ABNORMAL HIGH (ref 70–100)
Lab: 157 mg/dL — ABNORMAL HIGH (ref 70–100)
Lab: 198 mg/dL — ABNORMAL HIGH (ref 70–100)
Lab: 78 mg/dL (ref 70–100)

## 2017-10-09 LAB — HEMOGLOBIN A1C: Lab: 9.3 % — ABNORMAL HIGH (ref 4.0–6.0)

## 2017-10-09 LAB — CBC: Lab: 11 K/UL — ABNORMAL HIGH (ref 4.5–11.0)

## 2017-10-09 MED ORDER — DEXTROMETHORPHAN-GUAIFENESIN 10-100 MG/5 ML PO SYRP
10 mL | ORAL | 0 refills | Status: DC | PRN
Start: 2017-10-09 — End: 2017-10-13
  Administered 2017-10-09 – 2017-10-11 (×2): 10 mL via ORAL

## 2017-10-10 LAB — MAGNESIUM
Lab: 2.1 mg/dL — ABNORMAL LOW (ref >60)
Lab: 2.1 mg/dL — ABNORMAL LOW (ref 1.6–2.6)

## 2017-10-10 LAB — CULTURE-RESP,LOWER W/SENSITIVITY: Lab: LOW

## 2017-10-10 LAB — POC GLUCOSE
Lab: 227 mg/dL — ABNORMAL HIGH (ref 70–100)
Lab: 176 mg/dL — ABNORMAL HIGH (ref 70–100)
Lab: 196 mg/dL — ABNORMAL HIGH (ref 70–100)
Lab: 99 mg/dL (ref 70–100)

## 2017-10-10 LAB — BASIC METABOLIC PANEL
Lab: 137 MMOL/L — ABNORMAL LOW (ref 137–147)
Lab: 140 MMOL/L — ABNORMAL LOW (ref >60)
Lab: 3.2 MMOL/L — ABNORMAL LOW (ref >60)

## 2017-10-10 LAB — CBC: Lab: 10 10*3/uL — ABNORMAL LOW (ref 4.5–11.0)

## 2017-10-10 MED ORDER — POLYETHYLENE GLYCOL 3350 17 GRAM PO PWPK
1 | Freq: Two times a day (BID) | ORAL | 0 refills | Status: DC
Start: 2017-10-10 — End: 2017-10-13
  Administered 2017-10-10 – 2017-10-12 (×2): 17 g via ORAL

## 2017-10-10 MED ORDER — SENNOSIDES-DOCUSATE SODIUM 8.6-50 MG PO TAB
1 | Freq: Two times a day (BID) | ORAL | 0 refills | Status: DC
Start: 2017-10-10 — End: 2017-10-13
  Administered 2017-10-10 – 2017-10-12 (×3): 1 via ORAL

## 2017-10-10 MED ORDER — ALUMINUM-MAGNESIUM HYDROXIDE 200-200 MG/5 ML PO SUSP
30 mL | ORAL | 0 refills | Status: DC | PRN
Start: 2017-10-10 — End: 2017-10-13

## 2017-10-10 MED ORDER — POTASSIUM CHLORIDE 10 MEQ PO TBTQ
40 meq | Freq: Once | ORAL | 0 refills | Status: CP
Start: 2017-10-10 — End: ?
  Administered 2017-10-11: 40 meq via ORAL

## 2017-10-11 ENCOUNTER — Encounter: Admit: 2017-10-11 | Discharge: 2017-10-11 | Payer: Medicaid Other

## 2017-10-11 DIAGNOSIS — I5043 Acute on chronic combined systolic (congestive) and diastolic (congestive) heart failure: Principal | ICD-10-CM

## 2017-10-11 LAB — MAGNESIUM
Lab: 2.2 mg/dL — ABNORMAL LOW (ref >60)
Lab: 2.2 mg/dL — ABNORMAL LOW (ref 1.6–2.6)

## 2017-10-11 LAB — POC GLUCOSE
Lab: 188 mg/dL — ABNORMAL HIGH (ref 70–100)
Lab: 181 mg/dL — ABNORMAL HIGH (ref 70–100)
Lab: 169 mg/dL — ABNORMAL HIGH (ref 70–100)
Lab: 176 mg/dL — ABNORMAL HIGH (ref 70–100)
Lab: 288 mg/dL — ABNORMAL HIGH (ref 70–100)

## 2017-10-11 LAB — POC PT/INR: Lab: 1.2 10*3/uL (ref 0.8–1.2)

## 2017-10-11 LAB — BASIC METABOLIC PANEL
Lab: 139 MMOL/L — ABNORMAL LOW (ref >60)
Lab: 141 MMOL/L — ABNORMAL LOW (ref >60)
Lab: 3.8 MMOL/L — ABNORMAL LOW (ref >60)

## 2017-10-11 LAB — CBC: Lab: 9 K/UL — ABNORMAL LOW (ref 4.5–11.0)

## 2017-10-11 MED ORDER — TORSEMIDE 20 MG PO TAB
100 mg | Freq: Two times a day (BID) | ORAL | 0 refills | Status: DC
Start: 2017-10-11 — End: 2017-10-12
  Administered 2017-10-12: 100 mg via ORAL

## 2017-10-11 MED ORDER — ACETAMINOPHEN 325 MG PO TAB
650 mg | ORAL | 0 refills | Status: DC | PRN
Start: 2017-10-11 — End: 2017-10-13

## 2017-10-11 MED ORDER — APIXABAN 5 MG PO TAB
5 mg | Freq: Two times a day (BID) | ORAL | 0 refills | Status: DC
Start: 2017-10-11 — End: 2017-10-13

## 2017-10-11 MED ORDER — HELP MEDICATION
Freq: Two times a day (BID) | ORAL | 0 refills | Status: DC
Start: 2017-10-11 — End: 2017-10-11

## 2017-10-11 MED ORDER — DIPHENHYDRAMINE HCL 50 MG/ML IJ SOLN
25 mg | INTRAVENOUS | 0 refills | Status: DC | PRN
Start: 2017-10-11 — End: 2017-10-13

## 2017-10-11 MED ORDER — SODIUM CHLORIDE 0.9 % IV SOLP
INTRAVENOUS | 0 refills | Status: AC
Start: 2017-10-11 — End: ?

## 2017-10-11 MED ORDER — DIPHENHYDRAMINE HCL 25 MG PO CAP
25 mg | ORAL | 0 refills | Status: DC | PRN
Start: 2017-10-11 — End: 2017-10-13

## 2017-10-12 ENCOUNTER — Encounter: Admit: 2017-10-12 | Discharge: 2017-10-12 | Payer: MEDICAID

## 2017-10-12 ENCOUNTER — Inpatient Hospital Stay: Admit: 2017-10-08 | Discharge: 2017-10-08 | Payer: MEDICAID

## 2017-10-12 ENCOUNTER — Inpatient Hospital Stay: Admit: 2017-10-12 | Discharge: 2017-10-12 | Payer: MEDICAID

## 2017-10-12 ENCOUNTER — Emergency Department: Admit: 2017-10-06 | Discharge: 2017-10-06 | Payer: MEDICAID

## 2017-10-12 ENCOUNTER — Emergency Department: Admit: 2017-10-06 | Discharge: 2017-10-06 | Payer: Medicaid Other

## 2017-10-12 ENCOUNTER — Inpatient Hospital Stay: Admit: 2017-10-06 | Discharge: 2017-10-12 | Disposition: A | Discharge status: Home / Self Care | Payer: MEDICAID

## 2017-10-12 ENCOUNTER — Inpatient Hospital Stay: Admit: 2017-10-11 | Discharge: 2017-10-11 | Payer: MEDICAID

## 2017-10-12 DIAGNOSIS — K5900 Constipation, unspecified: ICD-10-CM

## 2017-10-12 DIAGNOSIS — R042 Hemoptysis: ICD-10-CM

## 2017-10-12 DIAGNOSIS — E1142 Type 2 diabetes mellitus with diabetic polyneuropathy: ICD-10-CM

## 2017-10-12 DIAGNOSIS — J9601 Acute respiratory failure with hypoxia: ICD-10-CM

## 2017-10-12 DIAGNOSIS — I480 Paroxysmal atrial fibrillation: ICD-10-CM

## 2017-10-12 DIAGNOSIS — I429 Cardiomyopathy, unspecified: Principal | ICD-10-CM

## 2017-10-12 DIAGNOSIS — Z7982 Long term (current) use of aspirin: ICD-10-CM

## 2017-10-12 DIAGNOSIS — A419 Sepsis, unspecified organism: Principal | ICD-10-CM

## 2017-10-12 DIAGNOSIS — Z95810 Presence of automatic (implantable) cardiac defibrillator: ICD-10-CM

## 2017-10-12 DIAGNOSIS — E669 Obesity, unspecified: ICD-10-CM

## 2017-10-12 DIAGNOSIS — Z9119 Patient's noncompliance with other medical treatment and regimen: ICD-10-CM

## 2017-10-12 DIAGNOSIS — I509 Heart failure, unspecified: ICD-10-CM

## 2017-10-12 DIAGNOSIS — R112 Nausea with vomiting, unspecified: ICD-10-CM

## 2017-10-12 DIAGNOSIS — I130 Hypertensive heart and chronic kidney disease with heart failure and stage 1 through stage 4 chronic kidney disease, or unspecified chronic kidney disease: ICD-10-CM

## 2017-10-12 DIAGNOSIS — Z89429 Acquired absence of other toe(s), unspecified side: ICD-10-CM

## 2017-10-12 DIAGNOSIS — L97519 Non-pressure chronic ulcer of other part of right foot with unspecified severity: ICD-10-CM

## 2017-10-12 DIAGNOSIS — K220 Achalasia of cardia: ICD-10-CM

## 2017-10-12 DIAGNOSIS — E785 Hyperlipidemia, unspecified: ICD-10-CM

## 2017-10-12 DIAGNOSIS — I2510 Atherosclerotic heart disease of native coronary artery without angina pectoris: ICD-10-CM

## 2017-10-12 DIAGNOSIS — E1151 Type 2 diabetes mellitus with diabetic peripheral angiopathy without gangrene: ICD-10-CM

## 2017-10-12 DIAGNOSIS — E1122 Type 2 diabetes mellitus with diabetic chronic kidney disease: ICD-10-CM

## 2017-10-12 DIAGNOSIS — N184 Chronic kidney disease, stage 4 (severe): ICD-10-CM

## 2017-10-12 DIAGNOSIS — Z7901 Long term (current) use of anticoagulants: ICD-10-CM

## 2017-10-12 DIAGNOSIS — I5043 Acute on chronic combined systolic (congestive) and diastolic (congestive) heart failure: ICD-10-CM

## 2017-10-12 DIAGNOSIS — J189 Pneumonia, unspecified organism: ICD-10-CM

## 2017-10-12 DIAGNOSIS — Z6839 Body mass index (BMI) 39.0-39.9, adult: ICD-10-CM

## 2017-10-12 DIAGNOSIS — E119 Type 2 diabetes mellitus without complications: ICD-10-CM

## 2017-10-12 DIAGNOSIS — Z794 Long term (current) use of insulin: ICD-10-CM

## 2017-10-12 DIAGNOSIS — I420 Dilated cardiomyopathy: ICD-10-CM

## 2017-10-12 DIAGNOSIS — K219 Gastro-esophageal reflux disease without esophagitis: ICD-10-CM

## 2017-10-12 DIAGNOSIS — F329 Major depressive disorder, single episode, unspecified: ICD-10-CM

## 2017-10-12 DIAGNOSIS — I739 Peripheral vascular disease, unspecified: ICD-10-CM

## 2017-10-12 LAB — POC GLUCOSE
Lab: 97 mg/dL (ref 70–100)
Lab: 114 mg/dL — ABNORMAL HIGH (ref 70–100)
Lab: 216 mg/dL — ABNORMAL HIGH (ref 70–100)
Lab: 203 mg/dL — ABNORMAL HIGH (ref 70–100)

## 2017-10-12 LAB — BASIC METABOLIC PANEL: Lab: 139 MMOL/L — ABNORMAL LOW (ref >60)

## 2017-10-12 LAB — CULTURE-BLOOD W/SENSITIVITY

## 2017-10-12 LAB — CBC: Lab: 7.4 10*3/uL — ABNORMAL LOW (ref 4.5–11.0)

## 2017-10-12 LAB — O2HGB SAT-VENOUS POC
Lab: 67 % (ref 55–71)
Lab: 69 % (ref 55–71)

## 2017-10-12 LAB — MAGNESIUM: Lab: 2.1 mg/dL — ABNORMAL LOW (ref 1.6–2.6)

## 2017-10-12 MED ORDER — TORSEMIDE 20 MG PO TAB
80 mg | ORAL_TABLET | Freq: Two times a day (BID) | ORAL | 3 refills | 67.50000 days | Status: AC
Start: 2017-10-12 — End: 2018-01-31
  Filled 2017-10-12 (×2): qty 360, 30d supply, fill #1

## 2017-10-12 MED ORDER — CEFPODOXIME 100 MG PO TAB
100 mg | ORAL_TABLET | Freq: Two times a day (BID) | ORAL | 0 refills | 7.00000 days | Status: AC
Start: 2017-10-12 — End: 2018-03-18
  Filled 2017-10-12 (×2): qty 3, 2d supply, fill #1

## 2017-10-12 MED ORDER — SOD BICARB-CITRIC AC-SIMETH 2.21-1.53 GRAM/4 GRAM PO GREP
1 | Freq: Once | ORAL | 0 refills | Status: CP
Start: 2017-10-12 — End: ?

## 2017-10-12 MED ORDER — TORSEMIDE 20 MG PO TAB
80 mg | Freq: Two times a day (BID) | ORAL | 0 refills | Status: DC
Start: 2017-10-12 — End: 2017-10-13

## 2017-10-12 MED ORDER — BARIUM SULFATE 98 % PO SUSR
130 mL | Freq: Once | ORAL | 0 refills | Status: CP
Start: 2017-10-12 — End: ?
  Administered 2017-10-12: 22:00:00 130 mL via ORAL

## 2017-10-13 ENCOUNTER — Encounter: Admit: 2017-10-13 | Discharge: 2017-10-13 | Payer: Medicaid Other

## 2017-10-13 LAB — CULTURE-BLOOD W/SENSITIVITY

## 2017-10-15 ENCOUNTER — Encounter: Admit: 2017-10-15 | Discharge: 2017-10-15 | Payer: Medicaid Other

## 2017-10-18 LAB — O2HGB SAT-VENOUS POC
Lab: 55 % (ref 55–71)
Lab: 60 % (ref 55–71)

## 2017-10-20 ENCOUNTER — Encounter: Admit: 2017-10-20 | Discharge: 2017-10-20 | Payer: MEDICAID

## 2017-10-26 ENCOUNTER — Encounter: Admit: 2017-10-26 | Discharge: 2017-10-26 | Payer: Medicaid Other

## 2017-10-27 ENCOUNTER — Encounter: Admit: 2017-10-27 | Discharge: 2017-10-27 | Payer: Medicaid Other

## 2017-10-27 MED ORDER — POTASSIUM CHLORIDE 10 MEQ PO TBER
40 meq | ORAL_CAPSULE | Freq: Two times a day (BID) | ORAL | 3 refills | Status: CN
Start: 2017-10-27 — End: ?

## 2017-11-08 ENCOUNTER — Encounter: Admit: 2017-11-08 | Discharge: 2017-11-08 | Payer: MEDICAID

## 2017-11-08 DIAGNOSIS — K220 Achalasia of cardia: Principal | ICD-10-CM

## 2017-11-10 ENCOUNTER — Ambulatory Visit: Admit: 2017-11-10 | Discharge: 2017-11-10 | Payer: MEDICAID

## 2017-11-10 DIAGNOSIS — I5022 Chronic systolic (congestive) heart failure: Principal | ICD-10-CM

## 2017-11-14 ENCOUNTER — Encounter: Admit: 2017-11-14 | Discharge: 2017-11-14 | Payer: Medicaid Other

## 2017-11-14 DIAGNOSIS — I5043 Acute on chronic combined systolic (congestive) and diastolic (congestive) heart failure: Principal | ICD-10-CM

## 2017-11-15 ENCOUNTER — Ambulatory Visit: Admit: 2017-11-15 | Discharge: 2017-11-15 | Payer: Medicaid Other

## 2017-11-16 ENCOUNTER — Encounter: Admit: 2017-11-16 | Discharge: 2017-11-16 | Payer: MEDICAID

## 2017-11-17 ENCOUNTER — Encounter: Admit: 2017-11-17 | Discharge: 2017-11-17 | Payer: Medicaid Other

## 2017-11-26 ENCOUNTER — Encounter: Admit: 2017-11-26 | Discharge: 2017-11-26 | Payer: MEDICAID

## 2017-11-29 ENCOUNTER — Encounter: Admit: 2017-11-29 | Discharge: 2017-11-29 | Payer: MEDICAID

## 2017-11-29 DIAGNOSIS — I420 Dilated cardiomyopathy: Principal | ICD-10-CM

## 2017-11-30 ENCOUNTER — Encounter: Admit: 2017-11-30 | Discharge: 2017-11-30 | Payer: MEDICAID

## 2017-11-30 ENCOUNTER — Encounter: Admit: 2017-11-30 | Discharge: 2017-11-30 | Payer: Medicaid Other

## 2017-11-30 MED ORDER — SPIRONOLACTONE 25 MG PO TAB
50 mg | ORAL_TABLET | Freq: Every day | ORAL | 3 refills | 46.00000 days | Status: AC
Start: 2017-11-30 — End: 2017-12-02
  Filled 2017-12-01: qty 180, 90d supply

## 2017-12-02 ENCOUNTER — Encounter: Admit: 2017-12-02 | Discharge: 2017-12-02 | Payer: MEDICAID

## 2017-12-02 MED ORDER — SPIRONOLACTONE 25 MG PO TAB
12.5 mg | ORAL_TABLET | Freq: Every day | ORAL | 3 refills | 90.00000 days | Status: AC
Start: 2017-12-02 — End: 2018-03-18

## 2017-12-04 ENCOUNTER — Encounter: Admit: 2017-12-04 | Discharge: 2017-12-04 | Payer: MEDICAID

## 2017-12-06 ENCOUNTER — Encounter: Admit: 2017-12-06 | Discharge: 2017-12-06 | Payer: Medicaid Other

## 2017-12-08 ENCOUNTER — Encounter: Admit: 2017-12-08 | Discharge: 2017-12-08 | Payer: Medicaid Other

## 2017-12-10 ENCOUNTER — Encounter: Admit: 2017-12-10 | Discharge: 2017-12-10 | Payer: Medicaid Other

## 2017-12-14 ENCOUNTER — Ambulatory Visit: Admit: 2017-12-14 | Discharge: 2017-12-15 | Payer: MEDICAID

## 2017-12-15 ENCOUNTER — Encounter: Admit: 2017-12-15 | Discharge: 2017-12-15 | Payer: MEDICAID

## 2017-12-15 DIAGNOSIS — I5022 Chronic systolic (congestive) heart failure: Principal | ICD-10-CM

## 2017-12-24 ENCOUNTER — Encounter: Admit: 2017-12-24 | Discharge: 2017-12-24 | Payer: MEDICAID

## 2017-12-24 ENCOUNTER — Ambulatory Visit: Admit: 2017-12-24 | Discharge: 2017-12-25 | Payer: MEDICAID

## 2017-12-25 DIAGNOSIS — Z95810 Presence of automatic (implantable) cardiac defibrillator: ICD-10-CM

## 2017-12-25 DIAGNOSIS — I5022 Chronic systolic (congestive) heart failure: Principal | ICD-10-CM

## 2017-12-27 ENCOUNTER — Encounter: Admit: 2017-12-27 | Discharge: 2017-12-27 | Payer: MEDICAID

## 2017-12-27 DIAGNOSIS — I5022 Chronic systolic (congestive) heart failure: Principal | ICD-10-CM

## 2017-12-27 IMAGING — CT Thorax^1_CHEST_ABD_PELVIS_WITHOUT (Adult)
1 of 3 series · 12 of 32 positions shown, 18 images · non-contrast
Comparison: none

[Series 3: ch, a/p w/o 5.0 soft tissue · axial · non-contrast · 0.98mm/px · z∈[-530,+50]mm · 12 of 134 slices shown, 18 images]
[im 10/134  soft-tissue]
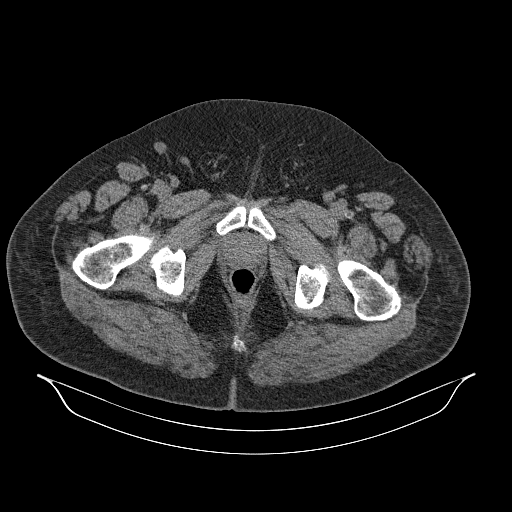
[im 10/134  bone]
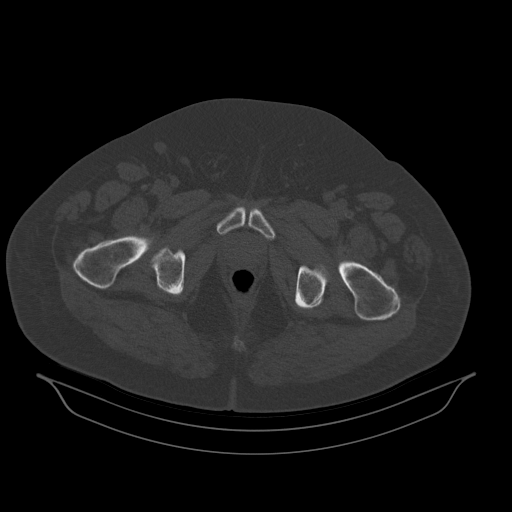
[im 20/134  soft-tissue]
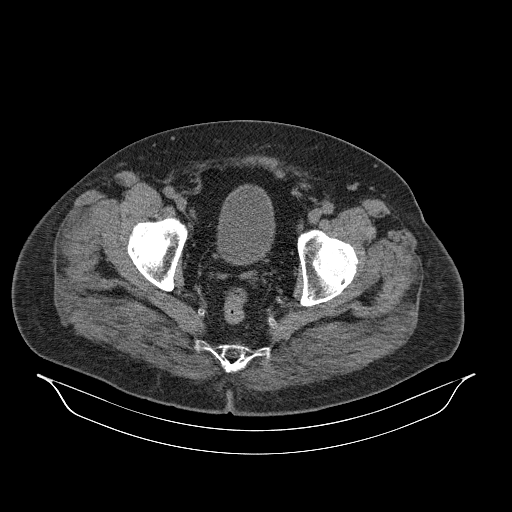
[im 29/134  soft-tissue]
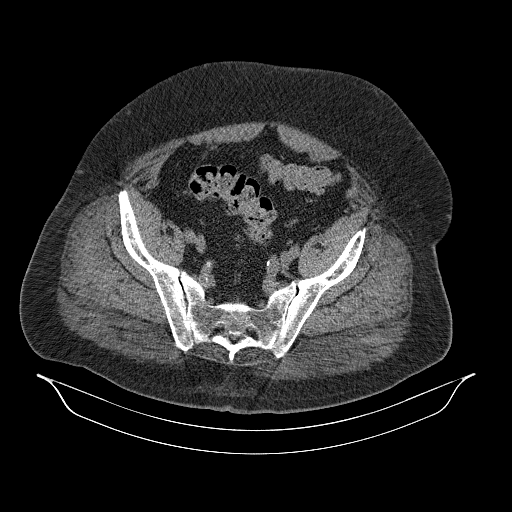
[im 39/134  soft-tissue]
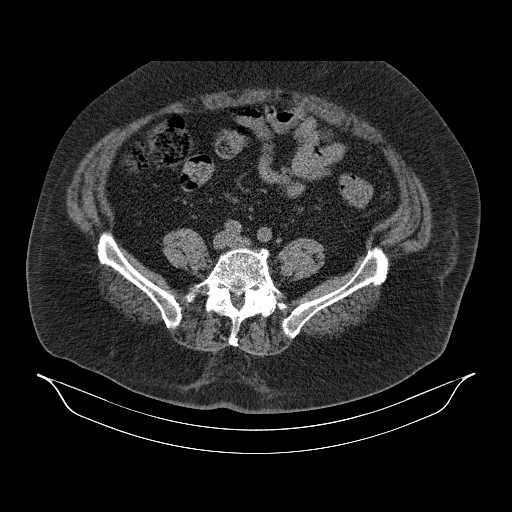
[im 48/134  soft-tissue]
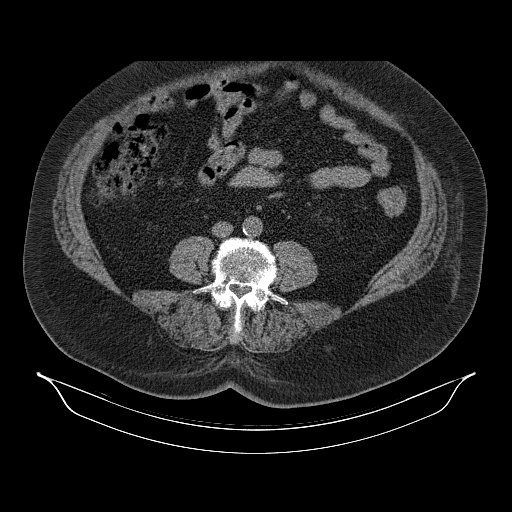
[im 58/134  soft-tissue]
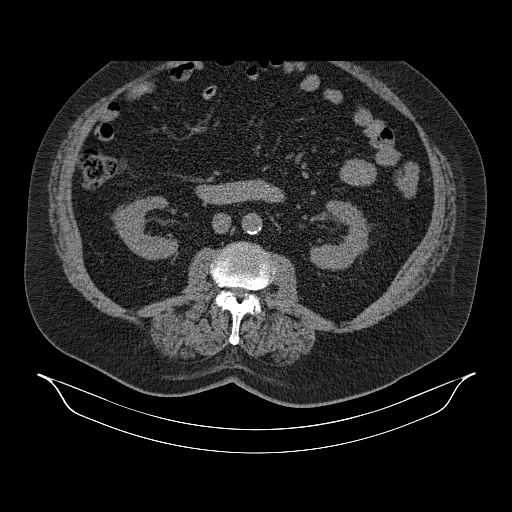
[im 77/134  soft-tissue]
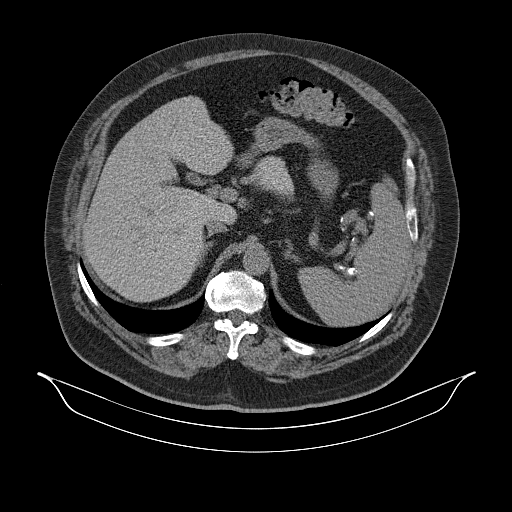
[im 86/134  soft-tissue]
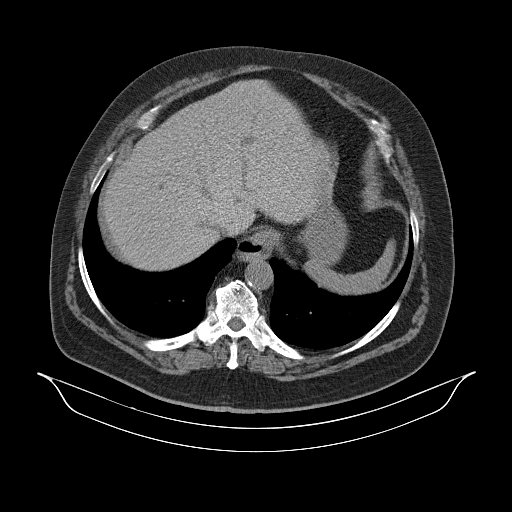
[im 96/134  soft-tissue]
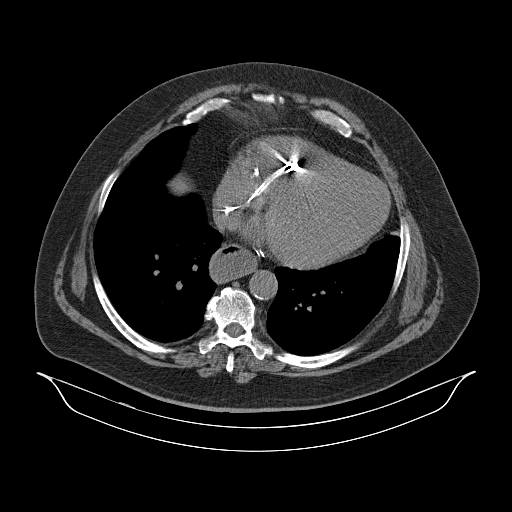
[im 96/134  lung]
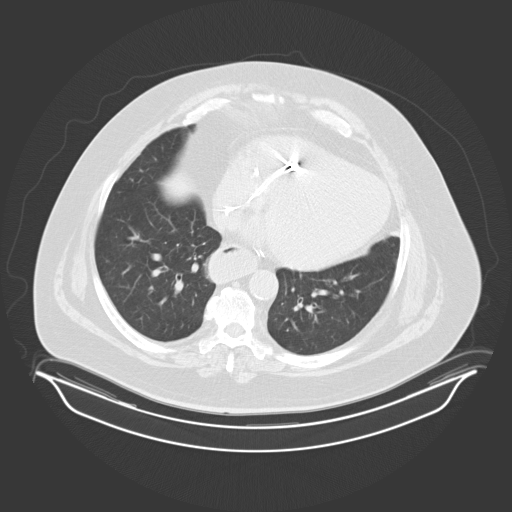
[im 96/134  bone]
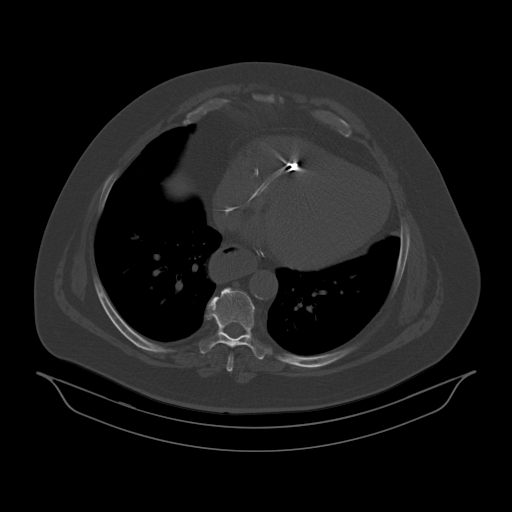
[im 105/134  soft-tissue]
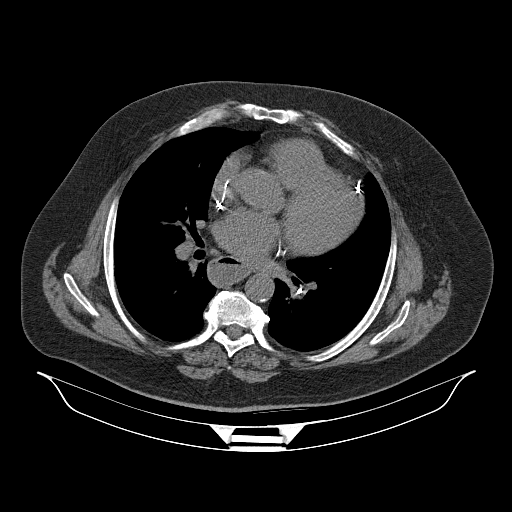
[im 105/134  lung]
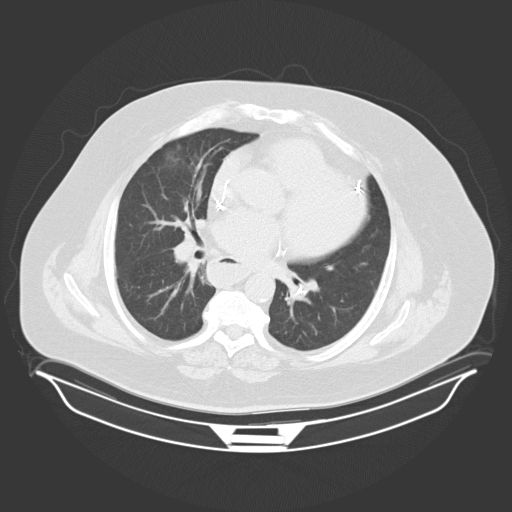
[im 115/134  soft-tissue]
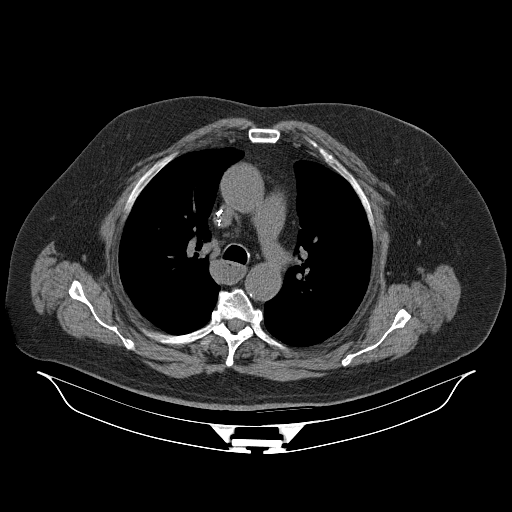
[im 115/134  lung]
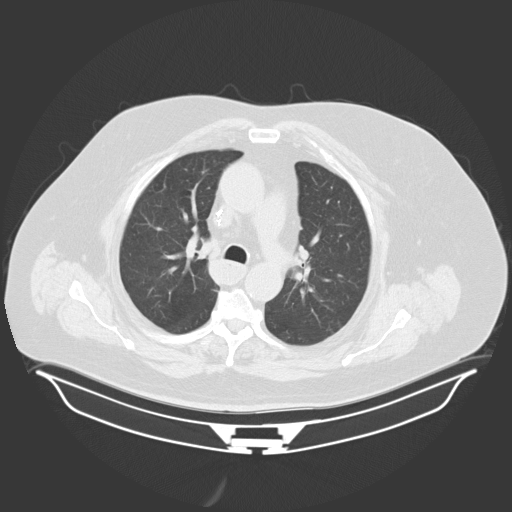
[im 124/134  soft-tissue]
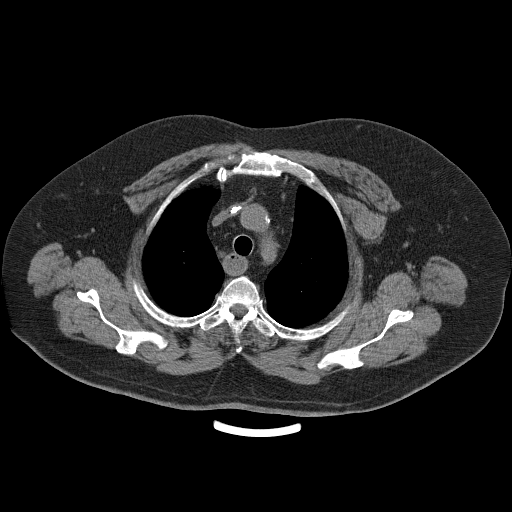
[im 124/134  lung]
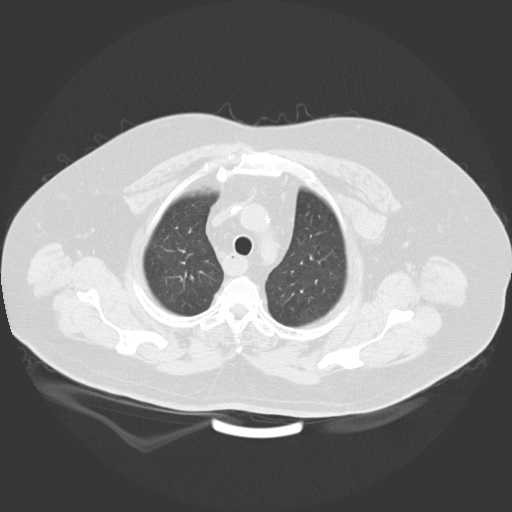

[12 of 32 positions shown; findings below may reference images not displayed]

DIAGNOSTIC STUDIES

EXAM

COMPUTED TOMOGRAPHY, SOFT TISSUE NECK, CHEST, ABDOMEN AND PELVIS; WITHOUT CONTRAST MATERIAL,
FOLLOWED BY CONTRAST MATERIAL(S),

INDICATION

f/u melanoma
F/U MELANOMA. GFR 61. [id number] [id number]

PT HAD AN ELEVATED CREAT. DR.RONLOR ORDERED FLUIDS FOR 2HRS PRIOR TO CT.
RECHECK CREAT WHICH WAS 1.8. GFR 61. DR.RONLOR STATED HE WAS OK WITH
CONTRAST GIVEN B/C BENEFITS OUTWEIGHED THE RISKS. (..)

TECHNIQUE

All CT scans at this facility use dose modulation, iterative reconstruction, and/or weight based
dosing when appropriate to reduce radiation dose to as low as reasonably achievable.

COMPARISONS

No priors available for comparison.

FINDINGS

Neck: No definite abnormal intracranial enhancement. The anterior aspect of the face was excluded
from the study. The visualized portion of the orbits appear grossly unremarkable. The paranasal
sinuses appear well aerated. The mastoid air cells appear well aerated. The parotid and
submandibular glands appear grossly unremarkable. The pharynx and larynx appear grossly
unremarkable. The thyroid gland is normal in size. No retropharyngeal or parapharyngeal fluid
collections. The carotid arteries and jugular veins are patent. No pathologically enlarged lymph
nodes. There is a 1.1 x 1.2 centimeter cutaneous mass within the left submandibular region, image

Chest: There is metallic artifact from a left-sided AICD. Calcified hilar nodes. There is mild
cardiomegaly. No aortic aneurysm or dissection. There is mild dilatation of the esophagus which
contains fluid. The distal esophagus measures up to 3.0 x 4.6 centimeters. 2 millimeter right upper
lobe nodule, image 12. Mild gynecomastia.

Abdomen and pelvis: There is hepatomegaly, 20 centimeters. The spleen is mildly enlarged, 14
centimeters. The pancreas and adrenal glands appear grossly unremarkable. Small layering gallstones
within the gallbladder lumen. There is no hydronephrosis. Mildly atrophic kidneys. 7 millimeter
exophytic lesion involving the interpolar region of the right kidney, likely a small cyst. The
urinary bladder is incompletely distended. No bowel obstruction or free air. There is at least mild
gastric mucosal thickening. Moderate degree of atherosclerotic disease within the infrarenal
abdominal aorta. 1.2 centimeter periportal node. Tiny umbilical hernia containing fat. The prostate
is normal in size. Small bilateral inguinal hernias containing fat.

Musculoskeletal: Severe bilateral neural foramen stenosis at the level of L5/S1. There are flowing
anterior osteophytes within the lower thoracic spine. Mild multilevel degenerative changes within
the lower cervical spine.

IMPRESSION

1. There is a 1.1 x 1.2 centimeter cutaneous mass within the left submandibular region, image 28.
Further characterization of this skin lesion is recommended in this patient with a history of
melanoma.

2. Mild diffuse dilatation of a fluid-filled esophagus. This can be seen in the setting of
achalasia. Follow-up with a gastroenterology consultation is recommended.

## 2017-12-27 IMAGING — CT Neck^1_CT_SOFT_TISSUE_NECK_WO_WITH (Adult)
2 of 4 series · 4 of 14 positions shown, 5 images · IV contrast (APPLIED)
Comparison: none

[Series 6: st neck 5.0 soft tissue · axial · 0.47mm/px · z∈[+97,+182]mm · 2 of 53 slices shown, 3 images]
[im 18/53  soft-tissue]
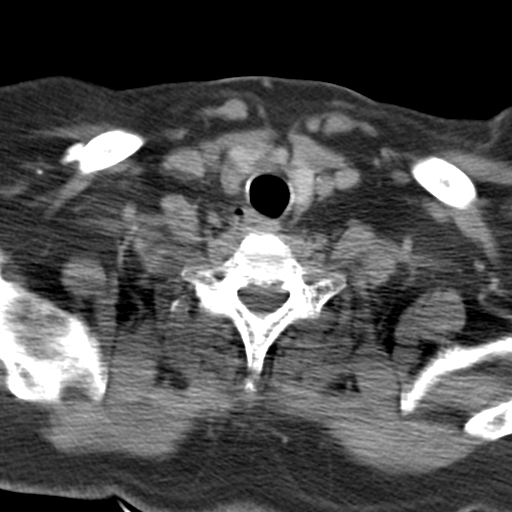
[im 18/53  bone]
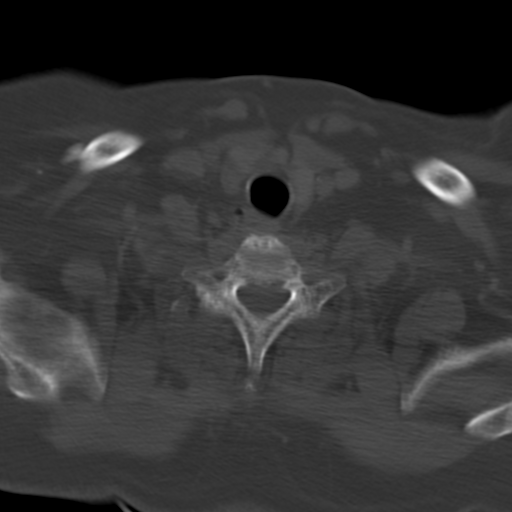
[im 35/53  bone]
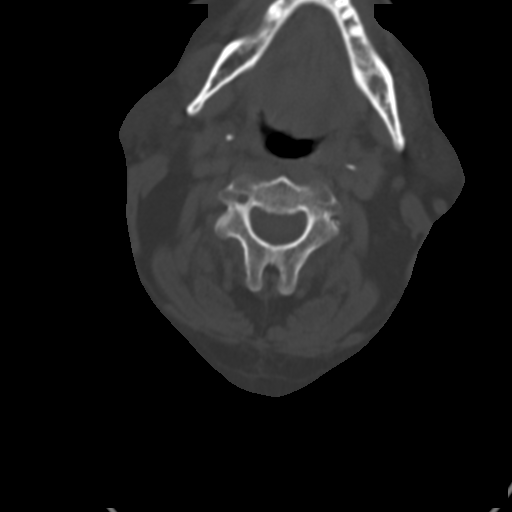

[Series 16: st neck 5.0 soft (id) · axial · 0.56mm/px · z∈[+97,+187]mm · 2 of 55 slices shown]
[im 19/55  soft-tissue]
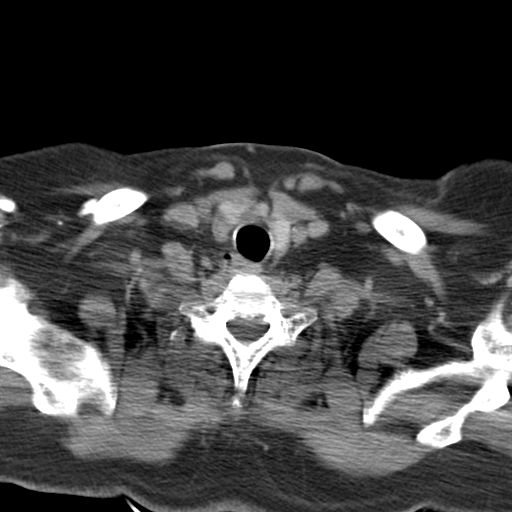
[im 37/55  soft-tissue]
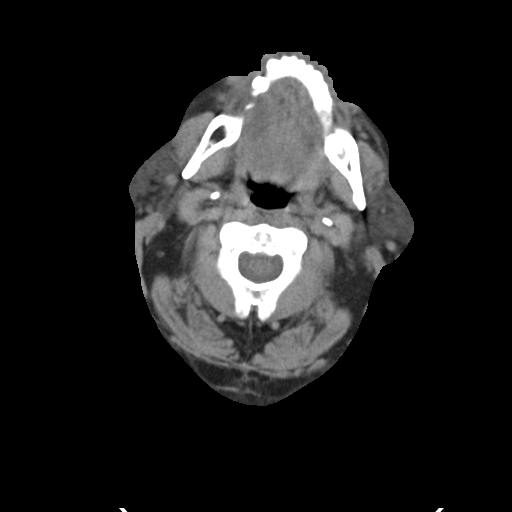

[4 of 14 positions shown; findings below may reference images not displayed]

DIAGNOSTIC STUDIES

EXAM

COMPUTED TOMOGRAPHY, SOFT TISSUE NECK, CHEST, ABDOMEN AND PELVIS; WITHOUT CONTRAST MATERIAL,
FOLLOWED BY CONTRAST MATERIAL(S),

INDICATION

f/u melanoma
F/U MELANOMA. GFR 61. [id number] K22YY

PT HAD AN ELEVATED CREAT. DR.RONLOR ORDERED FLUIDS FOR 2HRS PRIOR TO CT.
RECHECK CREAT WHICH WAS 1.8. GFR 61. DR.RONLOR STATED HE WAS OK WITH
CONTRAST GIVEN B/C BENEFITS OUTWEIGHED THE RISKS. (..)

TECHNIQUE

All CT scans at this facility use dose modulation, iterative reconstruction, and/or weight based
dosing when appropriate to reduce radiation dose to as low as reasonably achievable.

COMPARISONS

No priors available for comparison.

FINDINGS

Neck: No definite abnormal intracranial enhancement. The anterior aspect of the face was excluded
from the study. The visualized portion of the orbits appear grossly unremarkable. The paranasal
sinuses appear well aerated. The mastoid air cells appear well aerated. The parotid and
submandibular glands appear grossly unremarkable. The pharynx and larynx appear grossly
unremarkable. The thyroid gland is normal in size. No retropharyngeal or parapharyngeal fluid
collections. The carotid arteries and jugular veins are patent. No pathologically enlarged lymph
nodes. There is a 1.1 x 1.2 centimeter cutaneous mass within the left submandibular region, image

Chest: There is metallic artifact from a left-sided AICD. Calcified hilar nodes. There is mild
cardiomegaly. No aortic aneurysm or dissection. There is mild dilatation of the esophagus which
contains fluid. The distal esophagus measures up to 3.0 x 4.6 centimeters. 2 millimeter right upper
lobe nodule, image 12. Mild gynecomastia.

Abdomen and pelvis: There is hepatomegaly, 20 centimeters. The spleen is mildly enlarged, 14
centimeters. The pancreas and adrenal glands appear grossly unremarkable. Small layering gallstones
within the gallbladder lumen. There is no hydronephrosis. Mildly atrophic kidneys. 7 millimeter
exophytic lesion involving the interpolar region of the right kidney, likely a small cyst. The
urinary bladder is incompletely distended. No bowel obstruction or free air. There is at least mild
gastric mucosal thickening. Moderate degree of atherosclerotic disease within the infrarenal
abdominal aorta. 1.2 centimeter periportal node. Tiny umbilical hernia containing fat. The prostate
is normal in size. Small bilateral inguinal hernias containing fat.

Musculoskeletal: Severe bilateral neural foramen stenosis at the level of L5/S1. There are flowing
anterior osteophytes within the lower thoracic spine. Mild multilevel degenerative changes within
the lower cervical spine.

IMPRESSION

1. There is a 1.1 x 1.2 centimeter cutaneous mass within the left submandibular region, image 28.
Further characterization of this skin lesion is recommended in this patient with a history of
melanoma.

2. Mild diffuse dilatation of a fluid-filled esophagus. This can be seen in the setting of
achalasia. Follow-up with a gastroenterology consultation is recommended.

## 2017-12-30 ENCOUNTER — Encounter: Admit: 2017-12-30 | Discharge: 2017-12-30 | Payer: MEDICAID

## 2017-12-31 ENCOUNTER — Encounter: Admit: 2017-12-31 | Discharge: 2017-12-31 | Payer: MEDICAID

## 2018-01-03 ENCOUNTER — Encounter: Admit: 2018-01-03 | Discharge: 2018-01-03 | Payer: MEDICAID

## 2018-01-05 ENCOUNTER — Encounter: Admit: 2018-01-05 | Discharge: 2018-01-05 | Payer: MEDICAID

## 2018-01-05 ENCOUNTER — Ambulatory Visit: Admit: 2018-01-05 | Discharge: 2018-01-05 | Payer: MEDICAID

## 2018-01-05 ENCOUNTER — Encounter: Admit: 2018-01-05 | Discharge: 2018-01-05 | Payer: Medicaid Other

## 2018-01-05 DIAGNOSIS — M549 Dorsalgia, unspecified: ICD-10-CM

## 2018-01-05 DIAGNOSIS — R1310 Dysphagia, unspecified: Principal | ICD-10-CM

## 2018-01-05 DIAGNOSIS — I739 Peripheral vascular disease, unspecified: ICD-10-CM

## 2018-01-05 DIAGNOSIS — E119 Type 2 diabetes mellitus without complications: ICD-10-CM

## 2018-01-05 DIAGNOSIS — K220 Achalasia of cardia: ICD-10-CM

## 2018-01-05 DIAGNOSIS — I509 Heart failure, unspecified: ICD-10-CM

## 2018-01-05 DIAGNOSIS — I429 Cardiomyopathy, unspecified: Principal | ICD-10-CM

## 2018-01-05 MED ORDER — SODIUM CHLORIDE 0.9 % IV SOLP
INTRAVENOUS | 0 refills | Status: CN
Start: 2018-01-05 — End: ?

## 2018-01-06 ENCOUNTER — Encounter: Admit: 2018-01-06 | Discharge: 2018-01-06 | Payer: MEDICAID

## 2018-01-06 DIAGNOSIS — E119 Type 2 diabetes mellitus without complications: ICD-10-CM

## 2018-01-06 DIAGNOSIS — I509 Heart failure, unspecified: ICD-10-CM

## 2018-01-06 DIAGNOSIS — I429 Cardiomyopathy, unspecified: Principal | ICD-10-CM

## 2018-01-06 DIAGNOSIS — I739 Peripheral vascular disease, unspecified: ICD-10-CM

## 2018-01-07 ENCOUNTER — Encounter: Admit: 2018-01-07 | Discharge: 2018-01-07 | Payer: MEDICAID

## 2018-01-07 ENCOUNTER — Ambulatory Visit: Admit: 2018-01-07 | Discharge: 2018-01-07 | Payer: MEDICAID

## 2018-01-07 ENCOUNTER — Encounter: Admit: 2018-01-07 | Discharge: 2018-01-07 | Payer: Medicaid Other

## 2018-01-07 DIAGNOSIS — R1310 Dysphagia, unspecified: Principal | ICD-10-CM

## 2018-01-10 ENCOUNTER — Encounter: Admit: 2018-01-10 | Discharge: 2018-01-10 | Payer: Medicaid Other

## 2018-01-12 ENCOUNTER — Encounter: Admit: 2018-01-12 | Discharge: 2018-01-12 | Payer: MEDICAID

## 2018-01-17 ENCOUNTER — Encounter: Admit: 2018-01-17 | Discharge: 2018-01-17 | Payer: MEDICAID

## 2018-01-19 ENCOUNTER — Encounter: Admit: 2018-01-19 | Discharge: 2018-01-19 | Payer: MEDICAID

## 2018-01-19 ENCOUNTER — Ambulatory Visit: Admit: 2018-01-19 | Discharge: 2018-01-19 | Payer: MEDICAID

## 2018-01-19 ENCOUNTER — Encounter: Admit: 2018-01-19 | Discharge: 2018-01-19 | Payer: Medicaid Other

## 2018-01-19 DIAGNOSIS — I509 Heart failure, unspecified: ICD-10-CM

## 2018-01-19 DIAGNOSIS — C439 Malignant melanoma of skin, unspecified: ICD-10-CM

## 2018-01-19 DIAGNOSIS — E119 Type 2 diabetes mellitus without complications: ICD-10-CM

## 2018-01-19 DIAGNOSIS — I429 Cardiomyopathy, unspecified: Principal | ICD-10-CM

## 2018-01-19 DIAGNOSIS — I5022 Chronic systolic (congestive) heart failure: Principal | ICD-10-CM

## 2018-01-19 DIAGNOSIS — I739 Peripheral vascular disease, unspecified: ICD-10-CM

## 2018-01-20 ENCOUNTER — Encounter: Admit: 2018-01-20 | Discharge: 2018-01-20 | Payer: Medicaid Other

## 2018-01-20 DIAGNOSIS — I5022 Chronic systolic (congestive) heart failure: Principal | ICD-10-CM

## 2018-01-27 LAB — CBC AND DIFF: Lab: 11 — ABNORMAL HIGH

## 2018-01-27 LAB — COMPREHENSIVE METABOLIC PANEL
Lab: 134 — ABNORMAL LOW
Lab: 2.2 — ABNORMAL HIGH
Lab: 30 — ABNORMAL HIGH
Lab: 32 — ABNORMAL HIGH
Lab: 165 — ABNORMAL HIGH
Lab: 3
Lab: 30 — ABNORMAL LOW
Lab: 36 — ABNORMAL LOW
Lab: 4.3 — ABNORMAL LOW
Lab: 9
Lab: 99

## 2018-01-28 ENCOUNTER — Encounter: Admit: 2018-01-28 | Discharge: 2018-01-28 | Payer: MEDICAID

## 2018-01-31 MED ORDER — TORSEMIDE 20 MG PO TAB
ORAL_TABLET | ORAL | 3 refills | 67.50000 days | Status: AC
Start: 2018-01-31 — End: 2018-02-07

## 2018-02-04 ENCOUNTER — Encounter: Admit: 2018-02-04 | Discharge: 2018-02-04 | Payer: Medicaid Other

## 2018-02-04 ENCOUNTER — Encounter: Admit: 2018-02-04 | Discharge: 2018-02-04 | Payer: MEDICAID

## 2018-02-04 DIAGNOSIS — I5022 Chronic systolic (congestive) heart failure: ICD-10-CM

## 2018-02-04 DIAGNOSIS — I480 Paroxysmal atrial fibrillation: Principal | ICD-10-CM

## 2018-02-04 DIAGNOSIS — Z5181 Encounter for therapeutic drug level monitoring: ICD-10-CM

## 2018-02-04 MED ORDER — AMIODARONE 200 MG PO TAB
200 mg | ORAL_TABLET | Freq: Every day | ORAL | 1 refills | 42.00000 days | Status: AC
Start: 2018-02-04 — End: 2018-08-03

## 2018-02-07 MED ORDER — TORSEMIDE 20 MG PO TAB
ORAL_TABLET | ORAL | 3 refills | 67.50000 days | Status: AC
Start: 2018-02-07 — End: 2018-02-17

## 2018-02-08 ENCOUNTER — Encounter: Admit: 2018-02-08 | Discharge: 2018-02-08 | Payer: MEDICAID

## 2018-02-08 DIAGNOSIS — I5020 Unspecified systolic (congestive) heart failure: ICD-10-CM

## 2018-02-08 DIAGNOSIS — I5023 Acute on chronic systolic (congestive) heart failure: Principal | ICD-10-CM

## 2018-02-10 ENCOUNTER — Encounter: Admit: 2018-02-10 | Discharge: 2018-02-10 | Payer: Medicaid Other

## 2018-02-16 ENCOUNTER — Encounter: Admit: 2018-02-16 | Discharge: 2018-02-16 | Payer: MEDICAID

## 2018-02-17 ENCOUNTER — Encounter: Admit: 2018-02-17 | Discharge: 2018-02-17 | Payer: Medicaid Other

## 2018-02-17 ENCOUNTER — Encounter: Admit: 2018-02-17 | Discharge: 2018-02-17 | Payer: MEDICAID

## 2018-02-17 MED ORDER — TORSEMIDE 20 MG PO TAB
ORAL_TABLET | ORAL | 11 refills | 67.50000 days | Status: AC
Start: 2018-02-17 — End: 2018-03-31

## 2018-02-18 ENCOUNTER — Encounter: Admit: 2018-02-18 | Discharge: 2018-02-18 | Payer: Medicaid Other

## 2018-02-18 DIAGNOSIS — I5022 Chronic systolic (congestive) heart failure: Principal | ICD-10-CM

## 2018-02-18 MED ORDER — METOLAZONE 2.5 MG PO TAB
ORAL_TABLET | ORAL | 3 refills | 84.00000 days | Status: AC
Start: 2018-02-18 — End: 2018-03-18

## 2018-02-18 MED ORDER — POTASSIUM CHLORIDE 10 MEQ PO TBER
40 meq | Freq: Two times a day (BID) | ORAL | 0 refills | 30.00000 days | Status: AC
Start: 2018-02-18 — End: 2018-02-25

## 2018-02-22 DIAGNOSIS — I5020 Unspecified systolic (congestive) heart failure: Secondary | ICD-10-CM

## 2018-02-23 ENCOUNTER — Ambulatory Visit: Admit: 2018-02-22 | Discharge: 2018-02-23 | Payer: MEDICAID

## 2018-02-23 DIAGNOSIS — I5023 Acute on chronic systolic (congestive) heart failure: Principal | ICD-10-CM

## 2018-02-25 ENCOUNTER — Encounter: Admit: 2018-02-25 | Discharge: 2018-02-25 | Payer: Medicaid Other

## 2018-02-25 MED ORDER — POTASSIUM CHLORIDE 10 MEQ PO TBER
40 meq | ORAL_TABLET | Freq: Two times a day (BID) | ORAL | 11 refills | 30.00000 days | Status: AC
Start: 2018-02-25 — End: 2018-12-09

## 2018-02-28 ENCOUNTER — Encounter: Admit: 2018-02-28 | Discharge: 2018-02-28 | Payer: Medicaid Other

## 2018-02-28 DIAGNOSIS — I5022 Chronic systolic (congestive) heart failure: Principal | ICD-10-CM

## 2018-02-28 DIAGNOSIS — Z5181 Encounter for therapeutic drug level monitoring: ICD-10-CM

## 2018-03-14 ENCOUNTER — Encounter: Admit: 2018-03-14 | Discharge: 2018-03-14 | Payer: Medicaid Other

## 2018-03-14 DIAGNOSIS — I5022 Chronic systolic (congestive) heart failure: Principal | ICD-10-CM

## 2018-03-17 ENCOUNTER — Encounter: Admit: 2018-03-17 | Discharge: 2018-03-17 | Payer: MEDICAID

## 2018-03-17 DIAGNOSIS — I5022 Chronic systolic (congestive) heart failure: Principal | ICD-10-CM

## 2018-03-17 DIAGNOSIS — Z5181 Encounter for therapeutic drug level monitoring: ICD-10-CM

## 2018-03-17 LAB — LIVER FUNCTION PANEL
Lab: 6.5
Lab: 0.5 — AB
Lab: 3.1
Lab: 36

## 2018-03-17 LAB — TSH WITH FREE T4 REFLEX: Lab: 1.9

## 2018-03-17 LAB — BASIC METABOLIC PANEL
Lab: 7
Lab: 134
Lab: 18 (ref 5.0–8.0)
Lab: 28 /HPF (ref 0–3)
Lab: 327 (ref 0–5)
Lab: 4.1
Lab: 41
Lab: 99 /HPF — AB (ref 0–2)

## 2018-03-18 ENCOUNTER — Ambulatory Visit: Admit: 2018-03-18 | Discharge: 2018-03-18 | Payer: Medicaid Other

## 2018-03-18 ENCOUNTER — Ambulatory Visit: Admit: 2018-03-18 | Discharge: 2018-03-18 | Payer: MEDICAID

## 2018-03-18 ENCOUNTER — Encounter: Admit: 2018-03-18 | Discharge: 2018-03-18 | Payer: MEDICAID

## 2018-03-18 DIAGNOSIS — E785 Hyperlipidemia, unspecified: ICD-10-CM

## 2018-03-18 DIAGNOSIS — G20 Parkinson's disease: ICD-10-CM

## 2018-03-18 DIAGNOSIS — I5020 Unspecified systolic (congestive) heart failure: ICD-10-CM

## 2018-03-18 DIAGNOSIS — I5022 Chronic systolic (congestive) heart failure: ICD-10-CM

## 2018-03-18 DIAGNOSIS — I480 Paroxysmal atrial fibrillation: ICD-10-CM

## 2018-03-18 DIAGNOSIS — E11621 Type 2 diabetes mellitus with foot ulcer: ICD-10-CM

## 2018-03-18 DIAGNOSIS — E1143 Type 2 diabetes mellitus with diabetic autonomic (poly)neuropathy: ICD-10-CM

## 2018-03-18 DIAGNOSIS — R251 Tremor, unspecified: ICD-10-CM

## 2018-03-18 DIAGNOSIS — I10 Essential (primary) hypertension: ICD-10-CM

## 2018-03-18 DIAGNOSIS — I420 Dilated cardiomyopathy: Principal | ICD-10-CM

## 2018-03-18 DIAGNOSIS — Z79899 Other long term (current) drug therapy: ICD-10-CM

## 2018-03-18 DIAGNOSIS — Z5181 Encounter for therapeutic drug level monitoring: ICD-10-CM

## 2018-03-18 LAB — BASIC METABOLIC PANEL
Lab: 1.6 mg/dL — ABNORMAL HIGH (ref 0.4–1.24)
Lab: 10 (ref 3–12)
Lab: 135 MMOL/L — ABNORMAL LOW (ref 137–147)
Lab: 19 mg/dL (ref 7–25)
Lab: 26 MMOL/L (ref 21–30)
Lab: 4.1 MMOL/L (ref 3.5–5.1)
Lab: 43 mL/min — ABNORMAL LOW (ref >60)
Lab: 453 mg/dL — ABNORMAL HIGH (ref 70–100)
Lab: 52 mL/min — ABNORMAL LOW (ref >60)
Lab: 8.7 mg/dL (ref 8.5–10.6)
Lab: 99 MMOL/L (ref 98–110)

## 2018-03-18 LAB — TSH WITH FREE T4 REFLEX: Lab: 1.3 uU/mL (ref 0.35–5.00)

## 2018-03-18 LAB — AST (SGOT): Lab: 29 U/L (ref 7–40)

## 2018-03-18 LAB — ALT (SGPT): Lab: 26 U/L (ref 7–56)

## 2018-03-18 MED ORDER — SPIRONOLACTONE 25 MG PO TAB
25 mg | ORAL_TABLET | Freq: Every day | ORAL | 3 refills | 90.00000 days | Status: AC
Start: 2018-03-18 — End: 2019-04-11

## 2018-03-22 ENCOUNTER — Encounter: Admit: 2018-03-22 | Discharge: 2018-03-22 | Payer: MEDICAID

## 2018-03-25 ENCOUNTER — Ambulatory Visit: Admit: 2018-03-25 | Discharge: 2018-03-26 | Payer: MEDICAID

## 2018-03-25 ENCOUNTER — Ambulatory Visit: Admit: 2018-03-25 | Discharge: 2018-03-25 | Payer: Medicaid Other

## 2018-03-26 DIAGNOSIS — I5020 Unspecified systolic (congestive) heart failure: ICD-10-CM

## 2018-03-26 DIAGNOSIS — Z95810 Presence of automatic (implantable) cardiac defibrillator: Secondary | ICD-10-CM

## 2018-03-26 DIAGNOSIS — I5023 Acute on chronic systolic (congestive) heart failure: Principal | ICD-10-CM

## 2018-03-31 ENCOUNTER — Encounter: Admit: 2018-03-31 | Discharge: 2018-03-31 | Payer: Medicaid Other

## 2018-03-31 ENCOUNTER — Encounter: Admit: 2018-03-31 | Discharge: 2018-03-31 | Payer: MEDICAID

## 2018-03-31 MED ORDER — TORSEMIDE 20 MG PO TAB
100 mg | ORAL_TABLET | Freq: Two times a day (BID) | ORAL | 11 refills | 67.50000 days | Status: AC
Start: 2018-03-31 — End: 2018-03-31

## 2018-03-31 MED ORDER — TORSEMIDE 20 MG PO TAB
100 mg | ORAL_TABLET | Freq: Two times a day (BID) | ORAL | 11 refills | 67.50000 days | Status: AC
Start: 2018-03-31 — End: 2019-05-16

## 2018-04-04 ENCOUNTER — Encounter: Admit: 2018-04-04 | Discharge: 2018-04-04 | Payer: Medicaid Other

## 2018-04-04 DIAGNOSIS — I5023 Acute on chronic systolic (congestive) heart failure: Principal | ICD-10-CM

## 2018-04-11 ENCOUNTER — Encounter: Admit: 2018-04-11 | Discharge: 2018-04-11 | Payer: Medicaid Other

## 2018-04-11 DIAGNOSIS — I5023 Acute on chronic systolic (congestive) heart failure: Principal | ICD-10-CM

## 2018-04-11 LAB — BASIC METABOLIC PANEL
Lab: 133
Lab: 8.7

## 2018-04-14 ENCOUNTER — Encounter: Admit: 2018-04-14 | Discharge: 2018-04-14 | Payer: MEDICAID

## 2018-04-15 ENCOUNTER — Encounter: Admit: 2018-04-15 | Discharge: 2018-04-15 | Payer: MEDICAID

## 2018-04-15 MED ORDER — METOLAZONE 2.5 MG PO TAB
2.5 mg | ORAL_TABLET | ORAL | 3 refills | 84.00000 days | Status: AC | PRN
Start: 2018-04-15 — End: 2018-05-09

## 2018-04-25 ENCOUNTER — Encounter: Admit: 2018-04-25 | Discharge: 2018-04-25 | Payer: MEDICAID

## 2018-04-25 ENCOUNTER — Ambulatory Visit: Admit: 2018-04-25 | Discharge: 2018-04-26 | Payer: MEDICAID

## 2018-04-25 DIAGNOSIS — I5020 Unspecified systolic (congestive) heart failure: Secondary | ICD-10-CM

## 2018-04-25 DIAGNOSIS — I5022 Chronic systolic (congestive) heart failure: Principal | ICD-10-CM

## 2018-04-26 DIAGNOSIS — I5023 Acute on chronic systolic (congestive) heart failure: Principal | ICD-10-CM

## 2018-05-03 ENCOUNTER — Encounter: Admit: 2018-05-03 | Discharge: 2018-05-03 | Payer: Medicaid Other

## 2018-05-03 DIAGNOSIS — I5022 Chronic systolic (congestive) heart failure: Principal | ICD-10-CM

## 2018-05-06 ENCOUNTER — Encounter: Admit: 2018-05-06 | Discharge: 2018-05-06 | Payer: MEDICAID

## 2018-05-06 DIAGNOSIS — I5022 Chronic systolic (congestive) heart failure: Principal | ICD-10-CM

## 2018-05-06 LAB — BASIC METABOLIC PANEL
Lab: 27
Lab: 29
Lab: 30
Lab: 35
Lab: 135 mL/min (ref >60)
Lab: 319
Lab: 4.3 mL/min (ref >60)
Lab: 8.7
Lab: 97 10*3/uL (ref 0–0.20)

## 2018-05-09 MED ORDER — METOLAZONE 2.5 MG PO TAB
2.5 mg | ORAL_TABLET | ORAL | 3 refills | 84.00000 days | Status: AC | PRN
Start: 2018-05-09 — End: 2018-06-07

## 2018-05-25 ENCOUNTER — Encounter: Admit: 2018-05-25 | Discharge: 2018-05-25 | Payer: MEDICAID

## 2018-05-26 ENCOUNTER — Ambulatory Visit: Admit: 2018-05-26 | Discharge: 2018-05-27 | Payer: MEDICAID

## 2018-05-27 DIAGNOSIS — I5020 Unspecified systolic (congestive) heart failure: ICD-10-CM

## 2018-05-27 DIAGNOSIS — I5022 Chronic systolic (congestive) heart failure: Principal | ICD-10-CM

## 2018-05-27 DIAGNOSIS — I5023 Acute on chronic systolic (congestive) heart failure: ICD-10-CM

## 2018-06-02 ENCOUNTER — Encounter: Admit: 2018-06-02 | Discharge: 2018-06-02 | Payer: MEDICAID

## 2018-06-02 DIAGNOSIS — I5022 Chronic systolic (congestive) heart failure: Principal | ICD-10-CM

## 2018-06-02 LAB — BASIC METABOLIC PANEL
Lab: 136
Lab: 169
Lab: 2.4
Lab: 27
Lab: 30
Lab: 7
Lab: 27
Lab: 33
Lab: 4.5
Lab: 9.2
Lab: 99

## 2018-06-03 ENCOUNTER — Encounter: Admit: 2018-06-03 | Discharge: 2018-06-03 | Payer: MEDICAID

## 2018-06-03 DIAGNOSIS — I5022 Chronic systolic (congestive) heart failure: Principal | ICD-10-CM

## 2018-06-07 MED ORDER — METOLAZONE 2.5 MG PO TAB
2.5 mg | ORAL_TABLET | ORAL | 3 refills | 84.00000 days | Status: AC | PRN
Start: 2018-06-07 — End: 2018-07-22

## 2018-06-14 ENCOUNTER — Encounter: Admit: 2018-06-14 | Discharge: 2018-06-14 | Payer: Medicaid Other

## 2018-06-14 LAB — CBC AND DIFF
Lab: 12 — ABNORMAL HIGH (ref 4.0–10.8)
Lab: 42
Lab: 5.3
Lab: 79 — ABNORMAL LOW (ref 80–98)

## 2018-06-14 LAB — HEMOGLOBIN A1C: Lab: 10 — ABNORMAL HIGH (ref <=5.6)

## 2018-06-19 ENCOUNTER — Encounter: Admit: 2018-06-19 | Discharge: 2018-06-19 | Payer: MEDICAID

## 2018-06-20 ENCOUNTER — Encounter: Admit: 2018-06-20 | Discharge: 2018-06-20 | Payer: Medicaid Other

## 2018-06-21 ENCOUNTER — Encounter: Admit: 2018-06-21 | Discharge: 2018-06-21 | Payer: Medicaid Other

## 2018-06-22 ENCOUNTER — Encounter: Admit: 2018-06-22 | Discharge: 2018-06-22 | Payer: Medicaid Other

## 2018-06-23 ENCOUNTER — Encounter: Admit: 2018-06-23 | Discharge: 2018-06-23 | Payer: MEDICAID

## 2018-06-23 ENCOUNTER — Encounter: Admit: 2018-06-23 | Discharge: 2018-06-23 | Payer: Medicaid Other

## 2018-06-23 ENCOUNTER — Ambulatory Visit: Admit: 2018-06-23 | Discharge: 2018-06-23 | Payer: MEDICAID

## 2018-06-23 DIAGNOSIS — R1310 Dysphagia, unspecified: Principal | ICD-10-CM

## 2018-06-23 MED ORDER — SODIUM CHLORIDE 0.9 % IV SOLP
INTRAVENOUS | 0 refills | Status: CN
Start: 2018-06-23 — End: ?

## 2018-06-24 ENCOUNTER — Ambulatory Visit: Admit: 2018-06-24 | Discharge: 2018-06-25 | Payer: MEDICAID

## 2018-06-24 ENCOUNTER — Encounter: Admit: 2018-06-24 | Discharge: 2018-06-24 | Payer: Medicaid Other

## 2018-06-24 DIAGNOSIS — I480 Paroxysmal atrial fibrillation: ICD-10-CM

## 2018-06-24 DIAGNOSIS — Z95810 Presence of automatic (implantable) cardiac defibrillator: Principal | ICD-10-CM

## 2018-06-24 DIAGNOSIS — I420 Dilated cardiomyopathy: ICD-10-CM

## 2018-06-25 DIAGNOSIS — I480 Paroxysmal atrial fibrillation: ICD-10-CM

## 2018-06-25 DIAGNOSIS — I420 Dilated cardiomyopathy: ICD-10-CM

## 2018-06-25 DIAGNOSIS — Z95810 Presence of automatic (implantable) cardiac defibrillator: ICD-10-CM

## 2018-06-25 DIAGNOSIS — I5022 Chronic systolic (congestive) heart failure: Principal | ICD-10-CM

## 2018-06-30 ENCOUNTER — Encounter: Admit: 2018-06-30 | Discharge: 2018-06-30 | Payer: MEDICAID

## 2018-07-01 ENCOUNTER — Encounter: Admit: 2018-07-01 | Discharge: 2018-07-01 | Payer: Medicaid Other

## 2018-07-01 DIAGNOSIS — I5022 Chronic systolic (congestive) heart failure: Principal | ICD-10-CM

## 2018-07-05 ENCOUNTER — Encounter: Admit: 2018-07-05 | Discharge: 2018-07-05 | Payer: Medicaid Other

## 2018-07-05 ENCOUNTER — Ambulatory Visit: Admit: 2018-07-05 | Discharge: 2018-07-05 | Payer: MEDICAID

## 2018-07-05 ENCOUNTER — Encounter: Admit: 2018-07-05 | Discharge: 2018-07-05 | Payer: MEDICAID

## 2018-07-05 DIAGNOSIS — E119 Type 2 diabetes mellitus without complications: ICD-10-CM

## 2018-07-05 DIAGNOSIS — I509 Heart failure, unspecified: ICD-10-CM

## 2018-07-05 DIAGNOSIS — I5022 Chronic systolic (congestive) heart failure: ICD-10-CM

## 2018-07-05 DIAGNOSIS — I252 Old myocardial infarction: ICD-10-CM

## 2018-07-05 DIAGNOSIS — I420 Dilated cardiomyopathy: ICD-10-CM

## 2018-07-05 DIAGNOSIS — I5043 Acute on chronic combined systolic (congestive) and diastolic (congestive) heart failure: Secondary | ICD-10-CM

## 2018-07-05 DIAGNOSIS — I429 Cardiomyopathy, unspecified: Principal | ICD-10-CM

## 2018-07-05 DIAGNOSIS — I10 Essential (primary) hypertension: ICD-10-CM

## 2018-07-05 DIAGNOSIS — K220 Achalasia of cardia: Principal | ICD-10-CM

## 2018-07-05 DIAGNOSIS — E1143 Type 2 diabetes mellitus with diabetic autonomic (poly)neuropathy: ICD-10-CM

## 2018-07-05 DIAGNOSIS — I5023 Acute on chronic systolic (congestive) heart failure: ICD-10-CM

## 2018-07-05 DIAGNOSIS — N183 Chronic kidney disease, stage 3 (moderate): ICD-10-CM

## 2018-07-05 DIAGNOSIS — C439 Malignant melanoma of skin, unspecified: ICD-10-CM

## 2018-07-05 DIAGNOSIS — I739 Peripheral vascular disease, unspecified: ICD-10-CM

## 2018-07-05 LAB — BASIC METABOLIC PANEL
Lab: 12 10*3/uL (ref 3–12)
Lab: 134 MMOL/L — ABNORMAL LOW (ref >60)
Lab: 21 mL/min — ABNORMAL LOW (ref >60)
Lab: 25 mL/min — ABNORMAL LOW (ref >60)
Lab: 3.1 mg/dL — ABNORMAL HIGH (ref 0.4–1.24)
Lab: 32 MMOL/L — ABNORMAL HIGH (ref 21–30)
Lab: 335 mg/dL — ABNORMAL HIGH (ref 70–100)
Lab: 4.4 MMOL/L (ref 3.5–5.1)
Lab: 40 mg/dL — ABNORMAL HIGH (ref 7–25)
Lab: 9.5 mg/dL (ref 8.5–10.6)
Lab: 90 MMOL/L — ABNORMAL LOW (ref 98–110)

## 2018-07-05 LAB — BNP (B-TYPE NATRIURETIC PEPTI): Lab: 136 pg/mL — ABNORMAL HIGH (ref 0–100)

## 2018-07-08 ENCOUNTER — Encounter: Admit: 2018-07-08 | Discharge: 2018-07-08 | Payer: MEDICAID

## 2018-07-22 ENCOUNTER — Encounter: Admit: 2018-07-22 | Discharge: 2018-07-22 | Payer: Medicaid Other

## 2018-07-22 DIAGNOSIS — I5023 Acute on chronic systolic (congestive) heart failure: Principal | ICD-10-CM

## 2018-07-22 MED ORDER — METOLAZONE 2.5 MG PO TAB
2.5 mg | ORAL_TABLET | ORAL | 3 refills | 84.00000 days | Status: AC | PRN
Start: 2018-07-22 — End: 2018-10-31

## 2018-07-28 ENCOUNTER — Ambulatory Visit: Admit: 2018-07-28 | Discharge: 2018-07-29 | Payer: MEDICAID

## 2018-07-29 DIAGNOSIS — I5020 Unspecified systolic (congestive) heart failure: ICD-10-CM

## 2018-07-29 DIAGNOSIS — I5023 Acute on chronic systolic (congestive) heart failure: Principal | ICD-10-CM

## 2018-08-03 ENCOUNTER — Encounter: Admit: 2018-08-03 | Discharge: 2018-08-03 | Payer: MEDICAID

## 2018-08-03 DIAGNOSIS — I480 Paroxysmal atrial fibrillation: Principal | ICD-10-CM

## 2018-08-03 DIAGNOSIS — I5022 Chronic systolic (congestive) heart failure: ICD-10-CM

## 2018-08-03 MED ORDER — AMIODARONE 200 MG PO TAB
200 mg | ORAL_TABLET | Freq: Every day | ORAL | 1 refills | 42.00000 days | Status: AC
Start: 2018-08-03 — End: 2019-07-06

## 2018-08-18 ENCOUNTER — Encounter: Admit: 2018-08-18 | Discharge: 2018-08-18 | Payer: MEDICAID

## 2018-08-18 DIAGNOSIS — I5022 Chronic systolic (congestive) heart failure: Principal | ICD-10-CM

## 2018-08-18 MED ORDER — ALUMINUM-MAGNESIUM HYDROXIDE 200-200 MG/5 ML PO SUSP
30 mL | ORAL | 0 refills | Status: CN | PRN
Start: 2018-08-18 — End: ?

## 2018-08-18 MED ORDER — DOCUSATE SODIUM 100 MG PO CAP
100 mg | Freq: Every day | ORAL | 0 refills | Status: CN | PRN
Start: 2018-08-18 — End: ?

## 2018-08-18 MED ORDER — TEMAZEPAM 15 MG PO CAP
15 mg | Freq: Every evening | ORAL | 0 refills | Status: CN | PRN
Start: 2018-08-18 — End: ?

## 2018-08-18 MED ORDER — ACETAMINOPHEN 325 MG PO TAB
650 mg | ORAL | 0 refills | Status: CN | PRN
Start: 2018-08-18 — End: ?

## 2018-08-18 MED ORDER — NITROGLYCERIN 0.4 MG SL SUBL
.4 mg | SUBLINGUAL | 0 refills | Status: CN | PRN
Start: 2018-08-18 — End: ?

## 2018-08-23 ENCOUNTER — Encounter: Admit: 2018-08-23 | Discharge: 2018-08-23 | Payer: Medicaid Other

## 2018-08-23 ENCOUNTER — Encounter: Admit: 2018-08-23 | Discharge: 2018-08-23 | Payer: MEDICAID

## 2018-08-23 MED ORDER — APIXABAN 5 MG PO TAB
5 mg | ORAL_TABLET | Freq: Two times a day (BID) | ORAL | 0 refills | Status: AC
Start: 2018-08-23 — End: 2019-03-21

## 2018-08-30 ENCOUNTER — Encounter: Admit: 2018-08-30 | Discharge: 2018-08-30 | Payer: MEDICAID

## 2018-08-30 DIAGNOSIS — I5023 Acute on chronic systolic (congestive) heart failure: Principal | ICD-10-CM

## 2018-08-30 LAB — BASIC METABOLIC PANEL
Lab: 38
Lab: 8.9
Lab: 100 10*3/uL (ref 0–0.45)
Lab: 133 10*3/uL (ref 1.0–4.8)
Lab: 2.1
Lab: 24
Lab: 26 10*3/uL (ref 0–0.20)
Lab: 284
Lab: 31
Lab: 4.6 10*3/uL (ref 0–0.80)
Lab: 7

## 2018-08-31 ENCOUNTER — Encounter: Admit: 2018-08-31 | Discharge: 2018-08-31 | Payer: MEDICAID

## 2018-09-14 ENCOUNTER — Encounter: Admit: 2018-09-14 | Discharge: 2018-09-14 | Payer: MEDICAID

## 2018-09-14 DIAGNOSIS — I5023 Acute on chronic systolic (congestive) heart failure: Principal | ICD-10-CM

## 2018-09-15 ENCOUNTER — Encounter: Admit: 2018-09-15 | Discharge: 2018-09-15 | Payer: Medicaid Other

## 2018-09-15 DIAGNOSIS — I5022 Chronic systolic (congestive) heart failure: ICD-10-CM

## 2018-09-15 DIAGNOSIS — I420 Dilated cardiomyopathy: Principal | ICD-10-CM

## 2018-09-19 ENCOUNTER — Encounter: Admit: 2018-09-19 | Discharge: 2018-09-19 | Payer: Medicaid Other

## 2018-09-19 ENCOUNTER — Encounter: Admit: 2018-09-19 | Discharge: 2018-09-19 | Payer: MEDICAID

## 2018-09-23 ENCOUNTER — Ambulatory Visit: Admit: 2018-09-23 | Discharge: 2018-09-24 | Payer: MEDICAID

## 2018-09-24 DIAGNOSIS — I480 Paroxysmal atrial fibrillation: ICD-10-CM

## 2018-09-24 DIAGNOSIS — I5043 Acute on chronic combined systolic (congestive) and diastolic (congestive) heart failure: ICD-10-CM

## 2018-09-24 DIAGNOSIS — I420 Dilated cardiomyopathy: Principal | ICD-10-CM

## 2018-09-24 DIAGNOSIS — Z95810 Presence of automatic (implantable) cardiac defibrillator: ICD-10-CM

## 2018-10-04 ENCOUNTER — Encounter: Admit: 2018-10-04 | Discharge: 2018-10-04 | Payer: MEDICAID

## 2018-10-14 ENCOUNTER — Encounter: Admit: 2018-10-14 | Discharge: 2018-10-14

## 2018-10-14 DIAGNOSIS — Z5181 Encounter for therapeutic drug level monitoring: Secondary | ICD-10-CM

## 2018-10-25 ENCOUNTER — Encounter: Admit: 2018-10-25 | Discharge: 2018-10-25

## 2018-10-27 ENCOUNTER — Encounter: Admit: 2018-10-27 | Discharge: 2018-10-27

## 2018-10-31 ENCOUNTER — Encounter: Admit: 2018-10-31 | Discharge: 2018-10-31

## 2018-10-31 MED ORDER — METOLAZONE 2.5 MG PO TAB
2.5 mg | ORAL_TABLET | ORAL | 3 refills | 84.00000 days | Status: AC | PRN
Start: 2018-10-31 — End: ?

## 2018-11-07 ENCOUNTER — Encounter: Admit: 2018-11-07 | Discharge: 2018-11-07 | Payer: MEDICAID

## 2018-11-07 NOTE — Telephone Encounter
CardioMEMS Coordinator attempted to reach patient to attempt to reschedule re-calibration of CardioMEMS. No answer, left message requesting callback.

## 2018-11-08 ENCOUNTER — Encounter: Admit: 2018-11-08 | Discharge: 2018-11-08 | Payer: MEDICAID

## 2018-11-10 ENCOUNTER — Ambulatory Visit: Admit: 2018-11-10 | Discharge: 2018-11-10 | Payer: MEDICAID

## 2018-11-10 ENCOUNTER — Encounter: Admit: 2018-11-10 | Discharge: 2018-11-10 | Payer: MEDICAID

## 2018-11-10 ENCOUNTER — Encounter: Admit: 2018-11-10 | Discharge: 2018-11-10 | Payer: Medicaid Other

## 2018-11-10 DIAGNOSIS — E119 Type 2 diabetes mellitus without complications: ICD-10-CM

## 2018-11-10 DIAGNOSIS — K220 Achalasia of cardia: Principal | ICD-10-CM

## 2018-11-10 DIAGNOSIS — I509 Heart failure, unspecified: ICD-10-CM

## 2018-11-10 DIAGNOSIS — I739 Peripheral vascular disease, unspecified: ICD-10-CM

## 2018-11-10 DIAGNOSIS — I429 Cardiomyopathy, unspecified: Principal | ICD-10-CM

## 2018-11-10 DIAGNOSIS — C439 Malignant melanoma of skin, unspecified: ICD-10-CM

## 2018-11-10 DIAGNOSIS — Z9889 Other specified postprocedural states: ICD-10-CM

## 2018-11-10 DIAGNOSIS — F329 Major depressive disorder, single episode, unspecified: ICD-10-CM

## 2018-11-10 MED ORDER — LIDOCAINE HCL 2 % MM JELL
0 refills | Status: DC
Start: 2018-11-10 — End: 2018-11-10
  Administered 2018-11-10: 16:00:00 3 mL via TOPICAL

## 2018-11-11 ENCOUNTER — Encounter: Admit: 2018-11-11 | Discharge: 2018-11-11 | Payer: MEDICAID

## 2018-11-11 DIAGNOSIS — C439 Malignant melanoma of skin, unspecified: ICD-10-CM

## 2018-11-11 DIAGNOSIS — I429 Cardiomyopathy, unspecified: Principal | ICD-10-CM

## 2018-11-11 DIAGNOSIS — I739 Peripheral vascular disease, unspecified: ICD-10-CM

## 2018-11-11 DIAGNOSIS — E119 Type 2 diabetes mellitus without complications: ICD-10-CM

## 2018-11-11 DIAGNOSIS — I509 Heart failure, unspecified: ICD-10-CM

## 2018-11-11 DIAGNOSIS — F329 Major depressive disorder, single episode, unspecified: ICD-10-CM

## 2018-11-15 ENCOUNTER — Encounter: Admit: 2018-11-15 | Discharge: 2018-11-15 | Payer: MEDICAID

## 2018-11-15 NOTE — Telephone Encounter
Pt called reporting that he is to have a procedure on 3/3 and asks if he will be able to hold eliquis for 2 days prior, pt scheduled for ESOPHAGOGASTRODUODENOSCOPY WITH ENDOSCOPIC ULTRASOUND EXAMINATION

## 2018-11-16 ENCOUNTER — Encounter: Admit: 2018-11-16 | Discharge: 2018-11-16 | Payer: MEDICAID

## 2018-11-16 NOTE — Telephone Encounter
Patient called on 11/15/2018 regarding a procedure that is scheduled for 12/06/2018 and if it is okay to hold Eliquis for 2 days prior .  Per Dr. Chales Abrahams patient can hold Eliquis for 2 days prior to the procedure.       Renita Papa, MD  You 16 hours ago (5:09 PM)   Yes it Is ok to hold it 2 days prior.    Routing comment

## 2018-12-02 ENCOUNTER — Encounter: Admit: 2018-12-02 | Discharge: 2018-12-02 | Payer: MEDICAID

## 2018-12-06 ENCOUNTER — Encounter: Admit: 2018-12-06 | Discharge: 2018-12-06 | Payer: MEDICAID

## 2018-12-09 ENCOUNTER — Encounter: Admit: 2018-12-09 | Discharge: 2018-12-09 | Payer: Medicaid Other

## 2018-12-09 MED ORDER — POTASSIUM CHLORIDE 40 MEQ/15 ML PO LIQD
40 meq | Freq: Two times a day (BID) | ORAL | 11 refills | 45.00000 days | Status: AC
Start: 2018-12-09 — End: 2019-03-10

## 2018-12-23 ENCOUNTER — Encounter: Admit: 2018-12-23 | Discharge: 2018-12-23 | Payer: MEDICAID

## 2018-12-23 ENCOUNTER — Ambulatory Visit: Admit: 2018-12-23 | Discharge: 2018-12-23 | Payer: MEDICAID

## 2018-12-23 DIAGNOSIS — I480 Paroxysmal atrial fibrillation: ICD-10-CM

## 2018-12-23 DIAGNOSIS — Z95810 Presence of automatic (implantable) cardiac defibrillator: ICD-10-CM

## 2018-12-23 DIAGNOSIS — I420 Dilated cardiomyopathy: Principal | ICD-10-CM

## 2018-12-26 ENCOUNTER — Encounter: Admit: 2018-12-26 | Discharge: 2018-12-26 | Payer: Medicaid Other

## 2018-12-26 ENCOUNTER — Encounter: Admit: 2018-12-26 | Discharge: 2018-12-26 | Payer: MEDICAID

## 2019-01-10 ENCOUNTER — Encounter: Admit: 2019-01-10 | Discharge: 2019-01-10 | Payer: MEDICAID

## 2019-01-10 ENCOUNTER — Encounter: Admit: 2019-01-10 | Discharge: 2019-01-10 | Payer: Medicaid Other

## 2019-01-10 MED ORDER — BUPROPION SR 200 MG PO TBSR
200 mg | ORAL_TABLET | Freq: Two times a day (BID) | ORAL | 4 refills | 30.00000 days | Status: DC
Start: 2019-01-10 — End: 2019-08-18
  Filled 2019-03-21: qty 60, 30d supply, fill #1

## 2019-01-10 MED ORDER — ALBUTEROL SULFATE 90 MCG/ACTUATION IN HFAA
2 | Freq: Four times a day (QID) | RESPIRATORY_TRACT | 3 refills | Status: AC
Start: 2019-01-10 — End: ?

## 2019-01-10 MED ORDER — PEN NEEDLE, DIABETIC 31 GAUGE X 5/16" MISC NDLE
1 | Freq: Four times a day (QID) | 1 refills | 90.00000 days | Status: DC
Start: 2019-01-10 — End: 2019-04-04

## 2019-01-10 MED ORDER — OMEPRAZOLE 40 MG PO CPDR
40 mg | ORAL_CAPSULE | Freq: Every day | ORAL | 6 refills | Status: DC
Start: 2019-01-10 — End: 2019-09-08
  Filled 2019-03-21: qty 30, 90d supply, fill #1

## 2019-01-10 MED ORDER — ELIQUIS 5 MG PO TAB
5 mg | ORAL_TABLET | Freq: Two times a day (BID) | ORAL | 1 refills | Status: DC
Start: 2019-01-10 — End: 2019-05-27
  Filled 2019-04-04: qty 60, 30d supply, fill #1

## 2019-01-10 MED ORDER — TOUJEO SOLOSTAR U-300 INSULIN 300 UNIT/ML (1.5 ML) SC INPN
30 [IU] | Freq: Every day | INTRAMUSCULAR | 1 refills | 60.00000 days | Status: AC
Start: 2019-01-10 — End: ?

## 2019-01-11 ENCOUNTER — Encounter: Admit: 2019-01-11 | Discharge: 2019-01-11 | Payer: MEDICAID

## 2019-01-11 ENCOUNTER — Encounter: Admit: 2019-01-11 | Discharge: 2019-01-11 | Payer: Medicaid Other

## 2019-01-11 MED ORDER — INSULIN ASPART 100 UNIT/ML SC FLEXPEN
Freq: Three times a day (TID) | SUBCUTANEOUS | 1 refills | 30.00000 days | Status: AC
Start: 2019-01-11 — End: ?
  Filled 2019-02-14 (×2): qty 4, 31d supply, fill #1

## 2019-01-11 MED ORDER — AMIODARONE 200 MG PO TAB
200 mg | ORAL_TABLET | Freq: Every day | ORAL | 1 refills | 42.00000 days | Status: DC
Start: 2019-01-11 — End: 2020-01-24
  Filled 2019-01-22 (×2): qty 30, 30d supply, fill #1

## 2019-01-11 MED ORDER — SIMVASTATIN 20 MG PO TAB
20 mg | ORAL_TABLET | Freq: Every evening | ORAL | 2 refills | Status: DC
Start: 2019-01-11 — End: 2019-05-31
  Filled 2019-02-24 (×2): qty 30, 90d supply, fill #1

## 2019-01-11 MED ORDER — CYCLOBENZAPRINE 10 MG PO TAB
10 mg | ORAL_TABLET | Freq: Three times a day (TID) | ORAL | 1 refills | 21.00000 days | Status: AC | PRN
Start: 2019-01-11 — End: ?
  Filled 2019-01-15 (×2): qty 30, 10d supply, fill #1

## 2019-01-12 ENCOUNTER — Encounter: Admit: 2019-01-12 | Discharge: 2019-01-12 | Payer: MEDICAID

## 2019-01-12 ENCOUNTER — Encounter: Admit: 2019-01-12 | Discharge: 2019-01-12 | Payer: Medicaid Other

## 2019-01-15 ENCOUNTER — Encounter: Admit: 2019-01-15 | Discharge: 2019-01-15 | Payer: Medicaid Other

## 2019-01-17 ENCOUNTER — Encounter: Admit: 2019-01-17 | Discharge: 2019-01-17 | Payer: MEDICAID

## 2019-01-17 ENCOUNTER — Encounter: Admit: 2019-01-17 | Discharge: 2019-01-17 | Payer: Medicaid Other

## 2019-01-17 MED ORDER — CEFUROXIME AXETIL 500 MG PO TAB
500 mg | ORAL_TABLET | Freq: Two times a day (BID) | ORAL | 0 refills | Status: AC
Start: 2019-01-17 — End: ?
  Filled 2019-01-17 (×2): qty 14, 7d supply, fill #1

## 2019-01-21 ENCOUNTER — Encounter: Admit: 2019-01-21 | Discharge: 2019-01-21 | Payer: MEDICAID

## 2019-01-22 ENCOUNTER — Encounter: Admit: 2019-01-22 | Discharge: 2019-01-22 | Payer: MEDICAID

## 2019-01-27 ENCOUNTER — Encounter: Admit: 2019-01-27 | Discharge: 2019-01-27 | Payer: MEDICAID

## 2019-01-27 NOTE — Telephone Encounter
CardioMEMS Coordinator returned patient's earlier call. Patient informed coordinator that patient is currently hospitalized at [facility] in [place], [place], for the last few days. Patient states he was originally admitted for bronchitis and pneumonia, but was also seen by a wound care doctor as well for a wound on his foot. Patient states they may amputate one or more toes next week and that he is currently on IV antibiotics. Coordinator instructed patient to contact us once he has discharged from the hospital so that we can request records.     Patient's CardioMEMS needs to be re-calibrated, will plan for re-calibration after Covid-19 precuations have been lifted.

## 2019-02-07 ENCOUNTER — Encounter: Admit: 2019-02-07 | Discharge: 2019-02-07 | Payer: MEDICAID

## 2019-02-07 ENCOUNTER — Encounter: Admit: 2019-02-07 | Discharge: 2019-02-07 | Payer: Medicaid Other

## 2019-02-07 MED ORDER — DULOXETINE 30 MG PO CPDR
ORAL_CAPSULE | Freq: Every day | ORAL | 0 refills | Status: CN
Start: 2019-02-07 — End: ?

## 2019-02-07 MED FILL — DULOXETINE 30 MG PO CPDR: 30 mg | ORAL | 30 days supply | Qty: 60 | Status: CN

## 2019-02-07 NOTE — Telephone Encounter
[name], APRN, returned earlier call to discuss patient, who saw her to today to establish care as his PCP. CardioMEMS Coordinator informed [name] of patient's CardioMEMS device, and the fact that it needed to be re-calibrated to have accurate readings. We have not been able to re-calibrate due to patient cancelling previously and currently due to COVID-19 precautions. Patient has OV with Ivory Broad, APRN, on 6/5, will determine if we can schedule re-calibration at that visit if COVID-19 precautions allow. Provided [name] with my direct number, instructed her to call with any questions related to patient's CardioMEMS readings, also informed her that we would notify her when we are able to schedule patient's re-calibration.

## 2019-02-13 ENCOUNTER — Encounter: Admit: 2019-02-13 | Discharge: 2019-02-13 | Payer: Medicaid Other

## 2019-02-13 ENCOUNTER — Encounter: Admit: 2019-02-13 | Discharge: 2019-02-13 | Payer: MEDICAID

## 2019-02-14 MED FILL — DULOXETINE 30 MG PO CPDR: 30 mg | ORAL | 31 days supply | Qty: 60 | Fill #1 | Status: AC

## 2019-02-14 MED FILL — PEN NEEDLE, DIABETIC 31 GAUGE X 5/16" MISC NDLE: 31 gauge x 5/16" | 30 days supply | Qty: 100 | Fill #1 | Status: AC

## 2019-02-15 ENCOUNTER — Encounter: Admit: 2019-02-15 | Discharge: 2019-02-15 | Payer: Medicaid Other

## 2019-02-15 ENCOUNTER — Encounter: Admit: 2019-02-15 | Discharge: 2019-02-15 | Payer: MEDICAID

## 2019-02-15 MED ORDER — SANTYL 250 UNIT/GRAM TP OINT
0 refills | Status: DC
Start: 2019-02-15 — End: 2019-03-10
  Filled 2019-02-20 (×2): qty 90, 30d supply, fill #1

## 2019-02-16 ENCOUNTER — Encounter: Admit: 2019-02-16 | Discharge: 2019-02-16 | Payer: Medicaid Other

## 2019-02-16 ENCOUNTER — Encounter: Admit: 2019-02-16 | Discharge: 2019-02-16 | Payer: MEDICAID

## 2019-02-17 ENCOUNTER — Encounter: Admit: 2019-02-17 | Discharge: 2019-02-17 | Payer: Medicaid Other

## 2019-02-17 ENCOUNTER — Encounter: Admit: 2019-02-17 | Discharge: 2019-02-17 | Payer: MEDICAID

## 2019-02-20 ENCOUNTER — Encounter: Admit: 2019-02-20 | Discharge: 2019-02-20 | Payer: Medicaid Other

## 2019-02-24 ENCOUNTER — Encounter: Admit: 2019-02-24 | Discharge: 2019-02-24 | Payer: MEDICAID

## 2019-02-24 ENCOUNTER — Encounter: Admit: 2019-02-24 | Discharge: 2019-02-24 | Payer: Medicaid Other

## 2019-03-09 ENCOUNTER — Encounter: Admit: 2019-03-09 | Discharge: 2019-03-09

## 2019-03-09 NOTE — Telephone Encounter
Left message regarding in clinic appt with Aura Dials 03/10/19

## 2019-03-10 ENCOUNTER — Ambulatory Visit: Admit: 2019-03-10 | Discharge: 2019-03-11

## 2019-03-10 ENCOUNTER — Encounter: Admit: 2019-03-10 | Discharge: 2019-03-10

## 2019-03-10 DIAGNOSIS — E11621 Type 2 diabetes mellitus with foot ulcer: Secondary | ICD-10-CM

## 2019-03-10 DIAGNOSIS — I509 Heart failure, unspecified: Secondary | ICD-10-CM

## 2019-03-10 DIAGNOSIS — E119 Type 2 diabetes mellitus without complications: Secondary | ICD-10-CM

## 2019-03-10 DIAGNOSIS — I5042 Chronic combined systolic (congestive) and diastolic (congestive) heart failure: Principal | ICD-10-CM

## 2019-03-10 DIAGNOSIS — I428 Other cardiomyopathies: Secondary | ICD-10-CM

## 2019-03-10 DIAGNOSIS — I739 Peripheral vascular disease, unspecified: Secondary | ICD-10-CM

## 2019-03-10 DIAGNOSIS — F329 Major depressive disorder, single episode, unspecified: Secondary | ICD-10-CM

## 2019-03-10 DIAGNOSIS — C439 Malignant melanoma of skin, unspecified: Secondary | ICD-10-CM

## 2019-03-10 DIAGNOSIS — I10 Essential (primary) hypertension: Secondary | ICD-10-CM

## 2019-03-10 DIAGNOSIS — I429 Cardiomyopathy, unspecified: Secondary | ICD-10-CM

## 2019-03-11 DIAGNOSIS — I480 Paroxysmal atrial fibrillation: Secondary | ICD-10-CM

## 2019-03-11 DIAGNOSIS — N183 Chronic kidney disease, stage 3 (moderate): Secondary | ICD-10-CM

## 2019-03-13 ENCOUNTER — Encounter: Admit: 2019-03-13 | Discharge: 2019-03-13

## 2019-03-13 DIAGNOSIS — I5022 Chronic systolic (congestive) heart failure: Secondary | ICD-10-CM

## 2019-03-13 DIAGNOSIS — Z1159 Encounter for screening for other viral diseases: Secondary | ICD-10-CM

## 2019-03-13 MED ORDER — DOCUSATE SODIUM 100 MG PO CAP
100 mg | Freq: Every day | ORAL | 0 refills | Status: CN | PRN
Start: 2019-03-13 — End: ?

## 2019-03-13 MED ORDER — ASPIRIN 325 MG PO TAB
325 mg | Freq: Once | ORAL | 0 refills | Status: CN
Start: 2019-03-13 — End: ?

## 2019-03-13 MED ORDER — NITROGLYCERIN 0.4 MG SL SUBL
.4 mg | SUBLINGUAL | 0 refills | Status: CN | PRN
Start: 2019-03-13 — End: ?

## 2019-03-13 MED ORDER — ALUMINUM-MAGNESIUM HYDROXIDE 200-200 MG/5 ML PO SUSP
30 mL | ORAL | 0 refills | Status: CN | PRN
Start: 2019-03-13 — End: ?

## 2019-03-13 MED ORDER — ACETAMINOPHEN 325 MG PO TAB
650 mg | ORAL | 0 refills | Status: CN | PRN
Start: 2019-03-13 — End: ?

## 2019-03-14 ENCOUNTER — Encounter: Admit: 2019-03-14 | Discharge: 2019-03-14

## 2019-03-14 NOTE — Patient Instructions
CARDIAC CATHETERIZATION   PRE-ADMISSION INSTRUCTIONS    Patient Name: Larry Pena  MRN#: [id number]  Date of Birth: 1961-06-30 (58 y.o.)  Today's Date: 03/14/2019    PROCEDURE:  You are scheduled for a Right Heart Pressures Evaluation with CardioMEMS re-calibration  with Dr. Greig Castilla.    PROCEDURE DATE AND ARRIVAL TIME:    Your procedure date is 6/19.  You will receive a call from the Cath lab staff between 8:00 a.m. and noon on the business day prior to your procedure to let you know at what time to arrive on the day of your procedure.    Please check in at the Admitting Desk in the [facility] for your procedure. [facility] Entrance and and take a right. Continue down the hallway past [facility] office. That hall will take [facility] into [facility]. Check in at the desk on the left side.)     (If you have further questions regarding your arrival time for the CV lab, please call [phone] by 3:00pm the day before your procedure. Please leave a message with your name and number, your call will be returned in a timely manner.)    PRE-PROCEDURE APPOINTMENTS:  6/5--ALREADY Completed Office visit to update history and physical (requirement within 30 days of procedure)  with Ivory Broad, APRN at [facility]   6/17 Pre-Admission lab work required within 14 days of procedure: BMP and CBC at the lab of your choice.         SPECIAL MEDICATION INSTRUCTIONS  Nothing to eat after midnight before your procedure. No caffeine for 24 hours prior to your procedure. You will be under moderate sedation for your procedure.  You may drink clear liquids up to an hour before hospital arrival. This will be confirmed by the Cath lab staff the day before your procedure.        Any new prescriptions will be sent to your pharmacy listed on file with [place].   HOLD ALL over the counter vitamins or supplements on the morning of your procedure. Please either take 4 baby aspirins (4 times 81mg ) or one full strength NON-COATED 325mg  aspirin.  apixaban (Eliquis): hold 3 doses before your procedure.   Diuretics: Aldactone (spironolactone) -- hold the morning of your procedure. , Demadex (torsemide) -- hold the morning of your procedure.  and Zaroxolyn (metolazone) -- hold the morning of your procedure.   Insulin:  -- hold the morning of your procedure  Hypoglycemics: sitagliptin (Januvia) -- hold the morning of your procedure.      Additional Instructions  If you wear CPAP, please bring your mask and machine with you to the hospital.    Take a bath or shower with anti-bacterial soap the evening before, or the morning of the procedure.     Bring photo ID and your health insurance card(s).    Arrange for a driver to take you home from the hospital.    Bring an accurate list of your current medications with you to the hospital (all medications and supplements taken daily).  Please use the medication list below and write in the date and time when you took your last dose before your procedure. Update this list of medications as needed.      Wear comfortable clothes and don't bring valuables, other than photo identification card, with you to the hospital.    Please pack a bag for an overnight stay.     Please review your pre-procedure instructions  and bring them with you on the day of your procedure.  Call the office at [phone] with any questions. You may ask to speak with Dr. Sherlene Shams nurse.        ALLERGIES  No Known Allergies    CURRENT MEDICATIONS   Eligio, Angert   Home Medication Instructions HAR:    Printed on:03/14/19 1532   Medication Information                      albuterol sulfate (PROAIR HFA) 90 mcg/actuation aerosol inhaler  Inhale two puffs by mouth into the lungs four times daily. Shake well before use.             amiodarone (CORDARONE) 200 mg tablet  Take one tablet by mouth daily. Take with food.   Indications: Prevention of Recurrent Atrial Fibrillation             amiodarone (CORDARONE) 200 mg tablet  Take one tablet by mouth daily.             apixaban (ELIQUIS) 5 mg tablet  Take one tablet by mouth twice daily. Hold on 11/20 and 11/21 all day, and hold the morning of 11/22. Hold on 12/16 all day and the morning of 12/17.             apixaban (ELIQUIS) 5 mg tablet  Take one tablet by mouth twice daily.             aspirin EC 81 mg tablet  Take 81 mg by mouth daily. Take with food.              BD ULTRA-FINE SHORT PEN NEEDLE 31 gauge x 5/16 pen needle  Use as directed             buPROPion SR (WELLBUTRIN) 200 mg tablet  Take one tablet by mouth twice daily.             buPROPion SR(+) (WELLBUTRIN, ZYBAN) 200 mg tablet  Take 200 mg by mouth daily.             carvedilol (COREG) 3.125 mg tablet  Take 6.25 mg by mouth twice daily with meals. Take with food.   Indications: chronic heart failure             cyclobenzaprine (FLEXERIL) 10 mg tablet  Take 10 mg by mouth as Needed.             cyclobenzaprine (FLEXERIL) 10 mg tablet  Take one tablet by mouth three times daily as needed for Muscle Cramps.             duloxetine DR (CYMBALTA) 30 mg capsule  Take 1 capsule by mouth daily for 1 week, then 2 capsules  daily thereafter.             HYDROcodone/acetaminophen(+) (NORCO) 10/325 mg tablet  Take 1 tablet by mouth every 6 hours as needed for Pain             insulin aspart U-100 (NOVOLOG FLEXPEN U-100 INSULIN) 100 unit/mL injection PEN  Inject 16 Units under the skin three times daily with meals.             insulin aspart U-100 (NOVOLOG FLEXPEN) 100 unit/mL (3 mL) injection PEN  Inject 16 units subcutaneously three times daily before meals *Keep appointment with endocrinology             insulin glargine (TOUJEO SOLOSTAR) 300 unit/mL (1.5  mL) injectable  Inject 40 Units under the skin at bedtime daily.             insulin glargine U-300 conc (TOUJEO SOLOSTAR U-300 INSULIN) 300 unit/mL (1.5 mL) injectable Inject thirty Units under the skin as directed daily.             insulin pen needles (disposable) (UNIFINE PENTIPS) 31 gauge x 5/16 pen needle  Use with insulin 4 times daily as directed.             metOLazone (ZAROXOLYN) 2.5 mg tablet  Take one tablet by mouth as Needed. **TAKE ONLY WHEN DIRECTED BY HF TEAM** Take with *extra* 20 meq of potassium.             nitroglycerin (NITROSTAT) 0.4 mg tablet  Place 0.4 mg under tongue every 5 minutes as needed for Chest Pain. Max of 3 tablets, call 911.              omeprazole DR (PRILOSEC) 40 mg capsule  Take one capsule by mouth daily.             omeprazole DR(+) (PRILOSEC) 40 mg capsule  Take 40 mg by mouth daily before breakfast.             potassium chloride SR (K-DUR) 10 mEq tablet  Take 10 mEq by mouth four times daily as needed.             pregabalin (LYRICA) 150 mg capsule  Take 150 mg by mouth three times daily.             PROAIR HFA 90 mcg/actuation inhaler  Inhale 2 puffs by mouth into the lungs twice daily.             simvastatin (ZOCOR) 20 mg tablet  Take 20 mg by mouth at bedtime daily.             simvastatin (ZOCOR) 20 mg tablet  Take one tablet by mouth at bedtime daily.             sitaGLIPtin (JANUVIA) 100 mg tab tablet  Take 100 mg by mouth daily.             spironolactone (ALDACTONE) 25 mg tablet  Take one tablet by mouth daily. Take with food.             torsemide (DEMADEX) 20 mg tablet  Take five tablets by mouth twice daily.             TRUE METRIX GLUCOSE TEST STRIP test strip  Use as directed             UNILET LANCET 33 gauge  Use as directed               _________________________________________  Form completed by: Shirlean Kelly, RN  Date completed: 03/14/19  Method: Via telephone and mailed to the patient.

## 2019-03-15 ENCOUNTER — Encounter: Admit: 2019-03-15 | Discharge: 2019-03-15

## 2019-03-16 ENCOUNTER — Encounter: Admit: 2019-03-16 | Discharge: 2019-03-16

## 2019-03-16 MED FILL — INSULIN ASPART 100 UNIT/ML SC FLEXPEN: 100 unit/mL (3 mL) | SUBCUTANEOUS | 31 days supply | Qty: 15 | Fill #1 | Status: AC

## 2019-03-16 NOTE — Progress Notes
6-10 [facility] [phone] regarding [place] [address] spoke with Larkin Ina C. No Pre Cert needed REF# [id number] 03-15-19    Insurance current 6-10

## 2019-03-16 NOTE — Telephone Encounter
LATE ENTRY:    Patient contacted CardioMEMS Coordinator to inform that he would be unable to have right heart catheterization with CardioMEMS re-calibration, due to "having a bunch of appointments" that day. Patient also informed Coordinator that he would be on IV antibiotic therapy until at least 6/26. Patient is scheduled to receive final IV antibiotic infusion on 6/26 and follow up that day with his PCP to determine if further antibiotics are needed. Patient to contact Coordinator after PCP visit to work to reschedule re-calibration again.

## 2019-03-20 ENCOUNTER — Encounter: Admit: 2019-03-20 | Discharge: 2019-03-20

## 2019-03-20 MED ORDER — SPIRONOLACTONE 25 MG PO TAB
50 mg | ORAL_TABLET | Freq: Every day | ORAL | 3 refills
Start: 2019-03-20 — End: ?

## 2019-03-20 MED ORDER — PEN NEEDLE, DIABETIC 31 GAUGE X 5/16" MISC NDLE
1 | Freq: Four times a day (QID) | 1 refills | Status: CN
Start: 2019-03-20 — End: ?

## 2019-03-21 ENCOUNTER — Encounter: Admit: 2019-03-21 | Discharge: 2019-03-21

## 2019-03-21 NOTE — Telephone Encounter
Mattison, Nicole Lexine Baton) M, APRN-NP  P Cvm Nurse Hf Team Silver            Can you clarify his potassium for his medical record. He has a f/u with [place] in Aug. No medication changes. He needs to reschedule his CardioMEMS recalibration, he has canceled again.      LV 06/16 asking pt to CB to go over lab results with no medication changes per Maple Mirza, APRN and to verify current K-dur dose patient is taking. Pt's chart states patient takes "K-dur 10 meq four times daily as needed."

## 2019-03-24 ENCOUNTER — Encounter: Admit: 2019-03-24 | Discharge: 2019-03-24

## 2019-03-24 ENCOUNTER — Ambulatory Visit: Admit: 2019-03-24 | Discharge: 2019-03-25

## 2019-03-24 DIAGNOSIS — I420 Dilated cardiomyopathy: Secondary | ICD-10-CM

## 2019-03-24 DIAGNOSIS — I5043 Acute on chronic combined systolic (congestive) and diastolic (congestive) heart failure: Secondary | ICD-10-CM

## 2019-03-24 DIAGNOSIS — I480 Paroxysmal atrial fibrillation: Secondary | ICD-10-CM

## 2019-03-24 DIAGNOSIS — Z95810 Presence of automatic (implantable) cardiac defibrillator: Secondary | ICD-10-CM

## 2019-03-28 ENCOUNTER — Encounter: Admit: 2019-03-28 | Discharge: 2019-03-28

## 2019-03-28 NOTE — Telephone Encounter
Pt LVM inquiring a call back. LVM for pt inquiring returning his call, left call back number.

## 2019-03-29 ENCOUNTER — Encounter: Admit: 2019-03-29 | Discharge: 2019-03-29

## 2019-03-29 MED ORDER — FARXIGA 10 MG PO TAB
10 mg | ORAL_TABLET | Freq: Every morning | ORAL | 3 refills | 30.00000 days | Status: AC
Start: 2019-03-29 — End: ?
  Filled 2019-03-30: qty 90, 30d supply, fill #1

## 2019-03-29 MED ORDER — PEN NEEDLE, DIABETIC 31 GAUGE X 5/16" MISC NDLE
1 | Freq: Four times a day (QID) | 1 refills | Status: CN
Start: 2019-03-29 — End: ?

## 2019-03-29 MED ORDER — INSULIN ASPART 100 UNIT/ML SC FLEXPEN
14 [IU] | Freq: Three times a day (TID) | SUBCUTANEOUS | 3 refills | 30.00000 days | Status: DC
Start: 2019-03-29 — End: 2019-11-23

## 2019-03-29 MED ORDER — TOUJEO MAX U-300 SOLOSTAR 300 UNIT/ML (3 ML) SC INPN
50 [IU] | Freq: Every evening | SUBCUTANEOUS | 3 refills | 60.00000 days | Status: AC
Start: 2019-03-29 — End: ?
  Filled 2019-03-30: qty 60, 31d supply, fill #1

## 2019-03-31 ENCOUNTER — Encounter: Admit: 2019-03-31 | Discharge: 2019-03-31

## 2019-03-31 MED ORDER — PEN NEEDLE, DIABETIC 31 GAUGE X 5/16" MISC NDLE
1 | Freq: Four times a day (QID) | 1 refills | Status: CN
Start: 2019-03-31 — End: ?

## 2019-04-03 ENCOUNTER — Encounter: Admit: 2019-04-03 | Discharge: 2019-04-03

## 2019-04-03 MED ORDER — PEN NEEDLE, DIABETIC 31 GAUGE X 5/16" MISC NDLE
1 | Freq: Four times a day (QID) | 1 refills | Status: CN
Start: 2019-04-03 — End: ?

## 2019-04-03 MED ORDER — CARVEDILOL 6.25 MG PO TAB
6.25 mg | ORAL_TABLET | Freq: Two times a day (BID) | ORAL | 3 refills | 90.00000 days | Status: AC
Start: 2019-04-03 — End: ?
  Filled 2019-04-04: qty 180, 90d supply, fill #1

## 2019-04-04 ENCOUNTER — Encounter: Admit: 2019-04-04 | Discharge: 2019-04-04

## 2019-04-04 MED ORDER — PEN NEEDLE, DIABETIC 31 GAUGE X 5/16" MISC NDLE
Freq: Four times a day (QID) | 1 refills | 90.00000 days | Status: AC
Start: 2019-04-04 — End: ?
  Filled 2019-04-04: qty 100, 25d supply, fill #1

## 2019-04-04 MED ORDER — SPIRONOLACTONE 25 MG PO TAB
50 mg | ORAL_TABLET | Freq: Every day | ORAL | 3 refills | Status: CN
Start: 2019-04-04 — End: ?

## 2019-04-09 ENCOUNTER — Encounter: Admit: 2019-04-09 | Discharge: 2019-04-09

## 2019-04-09 NOTE — Progress Notes
Patient arrived to [facility] for COVID-19 testing 04/09/19 @ 1154. Patient identity confirmed via photo I.D. Nasopharyngeal procedure explained to the patient.   Nasopharyngeal swab completed right  Patient education provided given and instructed patient self isolate until contacted w/ results and further instructions.   Swab collected by  Amy Brookfield,PCA.    Date symptoms began/reason for testing: pre-op

## 2019-04-10 ENCOUNTER — Encounter: Admit: 2019-04-09 | Discharge: 2019-04-10

## 2019-04-10 DIAGNOSIS — Z1159 Encounter for screening for other viral diseases: Secondary | ICD-10-CM

## 2019-04-11 ENCOUNTER — Encounter: Admit: 2019-04-11 | Discharge: 2019-04-11

## 2019-04-11 LAB — COVID-19 (SARS-COV-2) PCR

## 2019-04-11 MED ORDER — SANTYL 250 UNIT/GRAM TP OINT
TOPICAL | 0 refills | 30.00000 days | Status: AC
Start: 2019-04-11 — End: ?
  Filled 2019-04-11: qty 90, 30d supply, fill #1

## 2019-04-11 MED ORDER — SPIRONOLACTONE 25 MG PO TAB
25 mg | ORAL_TABLET | Freq: Every day | ORAL | 3 refills | 90.00000 days | Status: CN
Start: 2019-04-11 — End: ?
  Filled 2019-04-11: qty 90, 90d supply

## 2019-04-11 MED ORDER — SPIRONOLACTONE 25 MG PO TAB
50 mg | ORAL_TABLET | Freq: Every day | ORAL | 3 refills | Status: CN
Start: 2019-04-11 — End: ?

## 2019-04-14 ENCOUNTER — Encounter: Admit: 2019-04-14 | Discharge: 2019-04-14

## 2019-04-21 ENCOUNTER — Encounter: Admit: 2019-04-21 | Discharge: 2019-04-21

## 2019-05-02 ENCOUNTER — Encounter: Admit: 2019-05-02 | Discharge: 2019-05-02

## 2019-05-02 MED FILL — PEN NEEDLE, DIABETIC 31 GAUGE X 5/16" MISC NDLE: 31 gauge x 5/16" | 25 days supply | Qty: 100 | Fill #2 | Status: AC

## 2019-05-02 MED FILL — BUPROPION SR 200 MG PO TBSR: 200 mg | ORAL | 30 days supply | Qty: 60 | Fill #2 | Status: AC

## 2019-05-02 MED FILL — SPIRONOLACTONE 25 MG PO TAB: 25 mg | ORAL | 90 days supply | Qty: 90 | Fill #1 | Status: AC

## 2019-05-03 ENCOUNTER — Encounter: Admit: 2019-05-03 | Discharge: 2019-05-03

## 2019-05-03 MED ORDER — BLOOD-GLUCOSE METER MISC MISC
PACK | 0 refills | 50.00000 days | Status: AC
Start: 2019-05-03 — End: ?
  Filled 2019-05-04: qty 1, 30d supply, fill #1

## 2019-05-03 MED ORDER — LANCETS 33 GAUGE MISC MISC
1 | Freq: Four times a day (QID) | 11 refills | Status: AC
Start: 2019-05-03 — End: ?

## 2019-05-03 MED ORDER — BLOOD SUGAR DIAGNOSTIC MISC STRP
ORAL_STRIP | Freq: Four times a day (QID) | 0 refills | 30.00000 days | Status: DC
Start: 2019-05-03 — End: 2019-11-23
  Filled 2019-05-04: qty 200, 25d supply, fill #1

## 2019-05-03 MED ORDER — TRUE METRIX GLUCOSE TEST STRIP MISC STRP
1 | ORAL_STRIP | Freq: Four times a day (QID) | 11 refills | 50.00000 days | Status: AC
Start: 2019-05-03 — End: ?

## 2019-05-03 MED ORDER — LANCETS 33 GAUGE MISC MISC
1 | Freq: Four times a day (QID) | 11 refills | Status: AC
Start: 2019-05-03 — End: ?
  Filled 2019-05-04: qty 200, 25d supply, fill #1

## 2019-05-03 MED FILL — INSULIN ASPART 100 UNIT/ML SC FLEXPEN: 100 unit/mL (3 mL) | SUBCUTANEOUS | 31 days supply | Qty: 15 | Fill #2 | Status: AC

## 2019-05-04 ENCOUNTER — Encounter: Admit: 2019-05-04 | Discharge: 2019-05-04

## 2019-05-05 ENCOUNTER — Encounter: Admit: 2019-05-05 | Discharge: 2019-05-05

## 2019-05-05 NOTE — Telephone Encounter
Attempted to contact patient to relay orders from Dr. Lyndel Safe that patient is okay to hold eliquis for upcoming EUS procedure. No answer, left voicemail for patient, instructed him to call back with any questions.

## 2019-05-09 ENCOUNTER — Encounter: Admit: 2019-05-09 | Discharge: 2019-05-09

## 2019-05-11 ENCOUNTER — Encounter: Admit: 2019-05-11 | Discharge: 2019-05-11

## 2019-05-11 MED FILL — SANTYL 250 UNIT/GRAM TP OINT: 250 unit/gram | 30 days supply | Qty: 90 | Fill #2 | Status: AC

## 2019-05-12 MED FILL — TOUJEO MAX U-300 SOLOSTAR 300 UNIT/ML (3 ML) SC INPN: 300 unit/mL (3 mL) | SUBCUTANEOUS | 31 days supply | Qty: 60 | Fill #2 | Status: AC

## 2019-05-15 ENCOUNTER — Encounter: Admit: 2019-05-15 | Discharge: 2019-05-15

## 2019-05-16 ENCOUNTER — Encounter: Admit: 2019-05-16 | Discharge: 2019-05-16

## 2019-05-16 MED ORDER — TORSEMIDE 20 MG PO TAB
100 mg | ORAL_TABLET | Freq: Two times a day (BID) | ORAL | 11 refills | 67.50000 days | Status: DC
Start: 2019-05-16 — End: 2019-08-11
  Filled 2019-05-16: qty 300, 30d supply, fill #1

## 2019-05-16 MED ORDER — POTASSIUM CHLORIDE 10 MEQ PO TBER
50 meq | ORAL_TABLET | Freq: Two times a day (BID) | ORAL | 3 refills | Status: CN
Start: 2019-05-16 — End: ?

## 2019-05-16 MED FILL — POTASSIUM CHLORIDE 10 MEQ PO TBER: 10 mEq | ORAL | 30 days supply | Qty: 300 | Status: CN

## 2019-05-24 ENCOUNTER — Encounter: Admit: 2019-05-24 | Discharge: 2019-05-24

## 2019-05-24 MED ORDER — CHOLECALCIFEROL (VITAMIN D3) 50 MCG (2,000 UNIT) PO TAB
2000 [IU] | ORAL_TABLET | Freq: Every day | ORAL | 2 refills | Status: CN
Start: 2019-05-24 — End: ?

## 2019-05-24 MED ORDER — ELIQUIS 5 MG PO TAB
5 mg | ORAL_TABLET | Freq: Two times a day (BID) | ORAL | 1 refills | Status: CN
Start: 2019-05-24 — End: ?

## 2019-05-24 MED ORDER — SIMVASTATIN 20 MG PO TAB
20 mg | ORAL_TABLET | Freq: Every evening | ORAL | 2 refills | Status: CN
Start: 2019-05-24 — End: ?

## 2019-05-24 MED FILL — POTASSIUM CHLORIDE 10 MEQ PO TBER: 10 mEq | ORAL | 30 days supply | Qty: 300 | Fill #1 | Status: AC

## 2019-05-27 ENCOUNTER — Encounter: Admit: 2019-05-27 | Discharge: 2019-05-27

## 2019-05-27 MED ORDER — NITROGLYCERIN 0.4 MG SL SUBL
.4 mg | ORAL_TABLET | SUBLINGUAL | 0 refills | 9.00000 days | Status: AC | PRN
Start: 2019-05-27 — End: ?
  Filled 2019-05-28: qty 125, 20d supply, fill #1

## 2019-05-27 MED ORDER — ELIQUIS 5 MG PO TAB
5 mg | ORAL_TABLET | Freq: Two times a day (BID) | ORAL | 1 refills | Status: DC
Start: 2019-05-27 — End: 2019-09-08
  Filled 2019-05-28: qty 60, 30d supply, fill #1

## 2019-05-28 ENCOUNTER — Encounter: Admit: 2019-05-28 | Discharge: 2019-05-28

## 2019-05-29 ENCOUNTER — Encounter: Admit: 2019-05-29 | Discharge: 2019-05-29

## 2019-05-30 ENCOUNTER — Encounter: Admit: 2019-05-30 | Discharge: 2019-05-30

## 2019-05-30 MED ORDER — SIMVASTATIN 20 MG PO TAB
20 mg | ORAL_TABLET | Freq: Every evening | ORAL | 2 refills | Status: CN
Start: 2019-05-30 — End: ?

## 2019-05-30 MED ORDER — LYRICA 200 MG PO CAP
200 mg | ORAL_CAPSULE | Freq: Three times a day (TID) | ORAL | 1 refills | Status: DC
Start: 2019-05-30 — End: 2019-06-27

## 2019-05-30 MED FILL — CHOLECALCIFEROL (VITAMIN D3) 50 MCG (2,000 UNIT) PO TAB: 50 mcg (2,000 unit) | ORAL | 90 days supply | Qty: 90 | Status: CN

## 2019-05-30 NOTE — Telephone Encounter
Pt called and LVM that his procedure is scheduled on 9/11 but his OV is 9/2. Inquiring if he should keep the OV or reschedule after he has the procedure.    Routing to Dr. Candi Leash. Please advise.

## 2019-05-31 ENCOUNTER — Encounter: Admit: 2019-05-31 | Discharge: 2019-05-31

## 2019-05-31 MED ORDER — SIMVASTATIN 20 MG PO TAB
20 mg | ORAL_TABLET | Freq: Every evening | ORAL | 0 refills | 90.00000 days | Status: DC
Start: 2019-05-31 — End: 2019-07-19

## 2019-05-31 MED ORDER — PREGABALIN 200 MG PO CAP
200 mg | ORAL_CAPSULE | Freq: Three times a day (TID) | ORAL | 1 refills | Status: DC
Start: 2019-05-31 — End: 2019-11-02
  Filled 2019-05-31: qty 270, 30d supply, fill #1

## 2019-06-01 ENCOUNTER — Encounter: Admit: 2019-06-01 | Discharge: 2019-06-01

## 2019-06-01 MED FILL — BUPROPION SR 200 MG PO TBSR: 200 mg | ORAL | 30 days supply | Qty: 60 | Fill #3 | Status: AC

## 2019-06-02 ENCOUNTER — Encounter: Admit: 2019-06-02 | Discharge: 2019-06-02

## 2019-06-02 NOTE — Telephone Encounter
Called and LVM for pt that he can call scheduling to reschedule after his procedure.

## 2019-06-06 ENCOUNTER — Encounter: Admit: 2019-06-06 | Discharge: 2019-06-06

## 2019-06-06 MED ORDER — SIMVASTATIN 20 MG PO TAB
20 mg | ORAL_TABLET | Freq: Every evening | ORAL | 0 refills
Start: 2019-06-06 — End: ?

## 2019-06-06 MED ORDER — SIMVASTATIN 20 MG PO TAB
20 mg | ORAL_TABLET | Freq: Every evening | ORAL | 0 refills | Status: CN
Start: 2019-06-06 — End: ?

## 2019-06-07 ENCOUNTER — Encounter: Admit: 2019-06-07 | Discharge: 2019-06-07

## 2019-06-07 MED ORDER — SIMVASTATIN 20 MG PO TAB
20 mg | ORAL_TABLET | Freq: Every evening | ORAL | 0 refills | 90.00000 days | Status: DC
Start: 2019-06-07 — End: 2019-07-06
  Filled 2019-06-07: qty 30, 30d supply, fill #1

## 2019-06-09 ENCOUNTER — Encounter: Admit: 2019-06-09 | Discharge: 2019-06-09

## 2019-06-09 MED FILL — OMEPRAZOLE 40 MG PO CPDR: 40 mg | ORAL | 90 days supply | Qty: 30 | Fill #2 | Status: AC

## 2019-06-14 DIAGNOSIS — Z01818 Encounter for other preprocedural examination: Secondary | ICD-10-CM

## 2019-06-14 NOTE — Progress Notes
Patient arrived to [facility] clinic for COVID-19 testing 06/14/19 1000. Patient identity confirmed via photo I.D. Nasopharyngeal procedure explained to the patient.   Nasopharyngeal swab completed right  Patient education provided given and instructed patient self isolate until contacted w/ results and further instructions. CDC handout on COVID-19 given to patient.   [email or web address]    Swab collected by Tyson Dense, RT.    Date symptoms began/reason for testing: Pre Procedure GI Endoscopy DOS 06/16/2019

## 2019-06-15 ENCOUNTER — Encounter: Admit: 2019-06-14 | Discharge: 2019-06-15

## 2019-06-15 DIAGNOSIS — Z1159 Encounter for screening for other viral diseases: Secondary | ICD-10-CM

## 2019-06-15 LAB — COVID-19 (SARS-COV-2) PCR

## 2019-06-15 NOTE — Progress Notes
Contacted patient and confirmed name and DOB. Patient advised that COVID-19 test results are negative. Advised that patient can continue with the procedure and should follow pre-procedure instructions. Advised patient to continue with home quarantine until procedure. Advised that if they develop any concerning symptoms prior to the procedure to contact their procedure team, specialist, and/or PCP for assistance.      , DNP, APRN, FNP-BC, CCRN, SCRN, CNRN

## 2019-06-16 ENCOUNTER — Encounter: Admit: 2019-06-16 | Discharge: 2019-06-16

## 2019-06-16 ENCOUNTER — Ambulatory Visit: Admit: 2019-06-16 | Discharge: 2019-06-16

## 2019-06-16 ENCOUNTER — Ambulatory Visit: Admit: 2019-06-15 | Discharge: 2019-06-15

## 2019-06-16 DIAGNOSIS — I429 Cardiomyopathy, unspecified: Secondary | ICD-10-CM

## 2019-06-16 DIAGNOSIS — C439 Malignant melanoma of skin, unspecified: Secondary | ICD-10-CM

## 2019-06-16 DIAGNOSIS — E119 Type 2 diabetes mellitus without complications: Secondary | ICD-10-CM

## 2019-06-16 DIAGNOSIS — R1310 Dysphagia, unspecified: Secondary | ICD-10-CM

## 2019-06-16 DIAGNOSIS — F329 Major depressive disorder, single episode, unspecified: Secondary | ICD-10-CM

## 2019-06-16 DIAGNOSIS — I739 Peripheral vascular disease, unspecified: Secondary | ICD-10-CM

## 2019-06-16 DIAGNOSIS — I509 Heart failure, unspecified: Secondary | ICD-10-CM

## 2019-06-16 MED ORDER — KETAMINE 10 MG/ML IJ SOLN
0 refills | Status: DC
Start: 2019-06-16 — End: 2019-06-16
  Administered 2019-06-16 (×3): 10 mg via INTRAVENOUS

## 2019-06-16 MED ORDER — PROPOFOL INJ 10 MG/ML IV VIAL
0 refills | Status: DC
Start: 2019-06-16 — End: 2019-06-16
  Administered 2019-06-16 (×14): 10 mg via INTRAVENOUS
  Administered 2019-06-16: 17:00:00 50 mg via INTRAVENOUS
  Administered 2019-06-16 (×13): 10 mg via INTRAVENOUS

## 2019-06-16 MED ORDER — SODIUM CHLORIDE 0.9 % IV SOLP
0 refills | Status: DC
Start: 2019-06-16 — End: 2019-06-16
  Administered 2019-06-16: 17:00:00 via INTRAVENOUS

## 2019-06-16 MED ORDER — LACTATED RINGERS IV SOLP
1000 mL | Freq: Once | INTRAVENOUS | 0 refills | Status: CP
Start: 2019-06-16 — End: ?
  Administered 2019-06-16: 16:00:00 1000 mL via INTRAVENOUS

## 2019-06-16 MED ORDER — LIDOCAINE (PF) 200 MG/10 ML (2 %) IJ SYRG
0 refills | Status: DC
Start: 2019-06-16 — End: 2019-06-16
  Administered 2019-06-16: 17:00:00 100 mg via INTRAVENOUS

## 2019-06-16 MED ORDER — FENTANYL CITRATE (PF) 50 MCG/ML IJ SOLN
0 refills | Status: DC
Start: 2019-06-16 — End: 2019-06-16
  Administered 2019-06-16: 17:00:00 25 ug via INTRAVENOUS
  Administered 2019-06-16: 17:00:00 50 ug via INTRAVENOUS
  Administered 2019-06-16: 17:00:00 25 ug via INTRAVENOUS

## 2019-06-16 NOTE — Anesthesia Post-Procedure Evaluation
Post-Anesthesia Evaluation    Name: Larry Pena      MRN: [id number]     DOB: 1961/01/15     Age: 58 y.o.     Sex: male   __________________________________________________________________________     Procedure Information     Anesthesia Start Date/Time:  06/16/19 1202    Procedures:       ESOPHAGOGASTRODUODENOSCOPY WITH ENDOSCOPIC ULTRASOUND EXAMINATION - FLEXIBLE (N/A )      ESOPHAGOGASTRODUODENOSCOPY WITH BIOPSY - FLEXIBLE (N/A )    Location:  ENDO 1 / ENDO/GI    Surgeon:  Threasa Alpha, MD          Post-Anesthesia Vitals      Vitals Value Taken Time   BP 110/62 06/16/2019  1:20 PM   Temp     Pulse 71 06/16/2019  1:20 PM   Respirations 15 PER MINUTE 06/16/2019  1:20 PM   SpO2 97 % 06/16/2019  1:05 PM         Post Anesthesia Evaluation Note    Evaluation location: pre/post  Patient participation: recovered; patient participated in evaluation  Level of consciousness: alert  Pain management: adequate    Hydration: normovolemia  Temperature: 36.0C - 38.4C  Airway patency: adequate    Perioperative Events      Postoperative Status  Cardiovascular status: hemodynamically stable  Respiratory status: spontaneous ventilation  Additional comments: Post-Anesthesia Evaluation Attestation: I reviewed and agree the indicated post-anesthesia care was provided. I have reviewed key portions of the indicated post anesthesia care. I have examined the patient's vitals, physical status, and complications and agree with what is documented.    Staff name:  Sanda Linger, MD Date:  06/16/2019            Perioperative Events  Perioperative Event: No  Emergency Case Activation: No

## 2019-06-17 ENCOUNTER — Encounter: Admit: 2019-06-17 | Discharge: 2019-06-17

## 2019-06-17 DIAGNOSIS — I739 Peripheral vascular disease, unspecified: Secondary | ICD-10-CM

## 2019-06-17 DIAGNOSIS — E119 Type 2 diabetes mellitus without complications: Secondary | ICD-10-CM

## 2019-06-17 DIAGNOSIS — C439 Malignant melanoma of skin, unspecified: Secondary | ICD-10-CM

## 2019-06-17 DIAGNOSIS — F329 Major depressive disorder, single episode, unspecified: Secondary | ICD-10-CM

## 2019-06-17 DIAGNOSIS — I509 Heart failure, unspecified: Secondary | ICD-10-CM

## 2019-06-17 DIAGNOSIS — I429 Cardiomyopathy, unspecified: Secondary | ICD-10-CM

## 2019-06-20 ENCOUNTER — Encounter: Admit: 2019-06-20 | Discharge: 2019-06-20

## 2019-06-20 ENCOUNTER — Ambulatory Visit: Admit: 2019-06-20 | Discharge: 2019-06-20 | Payer: MEDICAID

## 2019-06-20 MED ORDER — NITROGLYCERIN 0.4 MG SL SUBL
.4 mg | SUBLINGUAL | 0 refills | Status: CN | PRN
Start: 2019-06-20 — End: ?

## 2019-06-20 MED ORDER — ACETAMINOPHEN 325 MG PO TAB
650 mg | ORAL | 0 refills | Status: CN | PRN
Start: 2019-06-20 — End: ?

## 2019-06-20 MED ORDER — INSULIN ASPART 100 UNIT/ML SC FLEXPEN
Freq: Three times a day (TID) | SUBCUTANEOUS | 1 refills | Status: CN
Start: 2019-06-20 — End: ?

## 2019-06-20 MED ORDER — PEN NEEDLE, DIABETIC 31 GAUGE X 5/16" MISC NDLE
Freq: Four times a day (QID) | 1 refills | Status: CN
Start: 2019-06-20 — End: ?

## 2019-06-20 MED ORDER — DOCUSATE SODIUM 100 MG PO CAP
100 mg | Freq: Every day | ORAL | 0 refills | Status: CN | PRN
Start: 2019-06-20 — End: ?

## 2019-06-20 MED ORDER — ALUMINUM-MAGNESIUM HYDROXIDE 200-200 MG/5 ML PO SUSP
30 mL | ORAL | 0 refills | Status: CN | PRN
Start: 2019-06-20 — End: ?

## 2019-06-20 MED ORDER — TEMAZEPAM 15 MG PO CAP
15 mg | Freq: Every evening | ORAL | 0 refills | Status: CN | PRN
Start: 2019-06-20 — End: ?

## 2019-06-20 MED FILL — ELIQUIS 5 MG PO TAB: 5 mg | ORAL | 30 days supply | Qty: 60 | Fill #2 | Status: AC

## 2019-06-20 MED FILL — TOUJEO MAX U-300 SOLOSTAR 300 UNIT/ML (3 ML) SC INPN: 300 unit/mL (3 mL) | SUBCUTANEOUS | 31 days supply | Qty: 60 | Fill #3 | Status: AC

## 2019-06-21 ENCOUNTER — Encounter: Admit: 2019-06-21 | Discharge: 2019-06-21

## 2019-06-21 MED ORDER — RXAMB AMITR/GABAPEN/EMU OIL 4/4/10% CREAM (COMPOUND)
Freq: Three times a day (TID) | TOPICAL | 0 refills | Status: CN
Start: 2019-06-21 — End: ?

## 2019-06-22 ENCOUNTER — Encounter: Admit: 2019-06-22 | Discharge: 2019-06-22

## 2019-06-22 MED FILL — INSULIN ASPART 100 UNIT/ML SC FLEXPEN: 100 unit/mL (3 mL) | SUBCUTANEOUS | 31 days supply | Qty: 45 | Fill #1 | Status: AC

## 2019-06-23 ENCOUNTER — Encounter: Admit: 2019-06-23 | Discharge: 2019-06-23

## 2019-06-23 MED ORDER — PEN NEEDLE, DIABETIC 31 GAUGE X 5/16" MISC NDLE
Freq: Four times a day (QID) | 1 refills | Status: CN
Start: 2019-06-23 — End: ?

## 2019-06-23 MED ORDER — PEN NEEDLE, DIABETIC 31 GAUGE X 5/16" MISC NDLE
1 refills | 90.00000 days | Status: DC
Start: 2019-06-23 — End: 2019-09-08
  Filled 2019-06-23: qty 100, 25d supply, fill #1

## 2019-06-24 ENCOUNTER — Ambulatory Visit: Admit: 2019-06-24 | Discharge: 2019-06-24 | Payer: MEDICAID

## 2019-06-27 ENCOUNTER — Encounter: Admit: 2019-06-27 | Discharge: 2019-06-27

## 2019-06-27 MED FILL — PREGABALIN 200 MG PO CAP: 200 mg | ORAL | 30 days supply | Qty: 270 | Fill #2 | Status: AC

## 2019-06-28 ENCOUNTER — Encounter: Admit: 2019-06-28 | Discharge: 2019-06-28

## 2019-06-29 ENCOUNTER — Encounter: Admit: 2019-06-29 | Discharge: 2019-06-29

## 2019-06-30 ENCOUNTER — Encounter: Admit: 2019-06-30 | Discharge: 2019-06-30

## 2019-06-30 NOTE — Progress Notes
Notus 0000000 No pre-certification is required for  CATH 60454 per [facility] P.REF# [id number]    confirmed benefits and eligibility:  Current and active since 06-30-19 $ 0 deductible with required co-insurance of  0% to max OOP $ 0, then plan will pay 100% of allowable charges.

## 2019-07-01 ENCOUNTER — Encounter: Admit: 2019-07-01 | Discharge: 2019-07-01

## 2019-07-01 MED ORDER — PREGABALIN 200 MG PO CAP
200 mg | ORAL_CAPSULE | Freq: Three times a day (TID) | ORAL | 1 refills | Status: DC
Start: 2019-07-01 — End: 2019-11-02
  Filled 2019-07-12: qty 270, 13d supply, fill #3

## 2019-07-03 ENCOUNTER — Encounter

## 2019-07-03 DIAGNOSIS — Z1159 Encounter for screening for other viral diseases: Secondary | ICD-10-CM

## 2019-07-04 MED FILL — BUPROPION SR 200 MG PO TBSR: 200 mg | ORAL | 30 days supply | Qty: 60 | Fill #4 | Status: AC

## 2019-07-04 MED FILL — CARVEDILOL 6.25 MG PO TAB: 6.25 mg | ORAL | 90 days supply | Qty: 180 | Fill #2 | Status: AC

## 2019-07-05 ENCOUNTER — Encounter: Admit: 2019-07-05 | Discharge: 2019-07-05

## 2019-07-06 ENCOUNTER — Encounter: Admit: 2019-07-06 | Discharge: 2019-07-06

## 2019-07-06 ENCOUNTER — Ambulatory Visit: Admit: 2019-07-06 | Discharge: 2019-07-06

## 2019-07-06 ENCOUNTER — Ambulatory Visit: Admit: 2019-07-06 | Discharge: 2019-07-06 | Payer: MEDICAID

## 2019-07-06 DIAGNOSIS — F329 Major depressive disorder, single episode, unspecified: Secondary | ICD-10-CM

## 2019-07-06 DIAGNOSIS — N1832 Stage 3b chronic kidney disease: Secondary | ICD-10-CM

## 2019-07-06 DIAGNOSIS — I429 Cardiomyopathy, unspecified: Secondary | ICD-10-CM

## 2019-07-06 DIAGNOSIS — C439 Malignant melanoma of skin, unspecified: Secondary | ICD-10-CM

## 2019-07-06 DIAGNOSIS — Z95810 Presence of automatic (implantable) cardiac defibrillator: Secondary | ICD-10-CM

## 2019-07-06 DIAGNOSIS — E11621 Type 2 diabetes mellitus with foot ulcer: Secondary | ICD-10-CM

## 2019-07-06 DIAGNOSIS — I5023 Acute on chronic systolic (congestive) heart failure: Secondary | ICD-10-CM

## 2019-07-06 DIAGNOSIS — Z5181 Encounter for therapeutic drug level monitoring: Secondary | ICD-10-CM

## 2019-07-06 DIAGNOSIS — I739 Peripheral vascular disease, unspecified: Secondary | ICD-10-CM

## 2019-07-06 DIAGNOSIS — I5022 Chronic systolic (congestive) heart failure: Secondary | ICD-10-CM

## 2019-07-06 DIAGNOSIS — I509 Heart failure, unspecified: Secondary | ICD-10-CM

## 2019-07-06 DIAGNOSIS — E119 Type 2 diabetes mellitus without complications: Secondary | ICD-10-CM

## 2019-07-06 LAB — CBC
Lab: 10 10*3/uL (ref 4.5–11.0)
Lab: 15 % — ABNORMAL HIGH (ref >60)
Lab: 187 10*3/uL — ABNORMAL LOW (ref >60)
Lab: 25 pg — ABNORMAL LOW (ref 26–34)
Lab: 39 % — ABNORMAL LOW (ref 40–50)
Lab: 4.9 M/UL (ref 4.4–5.5)
Lab: 9.1 FL (ref 7–11)

## 2019-07-06 LAB — AST (SGOT): Lab: 15 U/L — ABNORMAL LOW (ref 7–40)

## 2019-07-06 LAB — ALT (SGPT): Lab: 14 U/L — ABNORMAL LOW (ref 7–56)

## 2019-07-06 LAB — BASIC METABOLIC PANEL
Lab: 136 MMOL/L — ABNORMAL LOW (ref 137–147)
Lab: 4.4 MMOL/L (ref 3.5–5.1)

## 2019-07-06 LAB — TSH WITH FREE T4 REFLEX: Lab: 1.7 uU/mL — ABNORMAL LOW (ref 0.35–5.00)

## 2019-07-06 NOTE — Progress Notes
Daily CardioMEMS Readings    Goal PA Diastolic: TO BE DETERMINED AFTER RE-CALIBRATION   PA Diastolic Thresholds:   Patient scheduled to be re-calibrated on 10/14.     Taken on PA Systolic PA Diastolic PA Mean Heart Rate   07-03-2019, 04:09 PM 74 mmHg 55 mmHg 62 mmHg 78 bpm   06-30-2019, 08:35 AM 75 mmHg 57 mmHg 63 mmHg 74 bpm   06-20-2019, 11:46 AM 73 mmHg 57 mmHg 63 mmHg 74 bpm   06-16-2019, 06:58 AM 91 mmHg 69 mmHg 77 mmHg 78 bpm   06-15-2019, 07:23 AM 98 mmHg 74 mmHg 83 mmHg 88 bpm   06-13-2019, 09:15 AM 87 mmHg 64 mmHg 72 mmHg 81 bpm   06-10-2019, 10:50 AM 73 mmHg 57 mmHg 62 mmHg 70 bpm   06-09-2019, 10:20 AM 84 mmHg 63 mmHg 71 mmHg 72 bpm   06-07-2019, 05:41 AM 76 mmHg 57 mmHg 64 mmHg 70 bpm   06-06-2019, 07:37 AM 73 mmHg 53 mmHg 60 mmHg 73 bpm   06-05-2019, 08:16 AM 75 mmHg 57 mmHg 64 mmHg 70 bpm   05-31-2019, 09:43 AM 74 mmHg 55 mmHg 62 mmHg 71 bpm   05-30-2019, 12:30 PM 70 mmHg 53 mmHg 59 mmHg 69 bpm   05-29-2019, 01:08 PM 72 mmHg 55 mmHg 61 mmHg 75 bpm   05-26-2019, 10:54 AM 84 mmHg 65 mmHg 72 mmHg 81 bpm   05-24-2019, 10:45 AM 92 mmHg 72 mmHg 79 mmHg 82 bpm   05-23-2019, 10:10 AM 95 mmHg 69 mmHg 79 mmHg 82 bpm   05-18-2019, 11:52 AM 76 mmHg 58 mmHg 64 mmHg 80 bpm   05-17-2019, 12:49 PM 76 mmHg 58 mmHg 65 mmHg 78 bpm   05-16-2019, 12:31 PM 74 mmHg 56 mmHg 63 mmHg 78 bpm   05-14-2019, 09:14 AM 77 mmHg 59 mmHg 66 mmHg 84 bpm   05-11-2019, 10:52 AM 77 mmHg 58 mmHg 65 mmHg 72 bpm   05-10-2019, 10:37 AM 79 mmHg 60 mmHg 67 mmHg 78 bpm   05-08-2019, 09:53 AM 75 mmHg 52 mmHg 61 mmHg 84 bpm   05-05-2019, 07:45 AM 70 mmHg 50 mmHg 58 mmHg 80 bpm   05-04-2019, 07:58 AM 78 mmHg 54 mmHg 63 mmHg 82 bpm   05-02-2019, 12:31 PM 59 mmHg 44 mmHg 49 mmHg 70 bpm   05-01-2019, 09:21 AM 73 mmHg 49 mmHg 58 mmHg 71 bpm   04-28-2019, 11:28 AM 68 mmHg 48 mmHg 56 mmHg 74 bpm   04-27-2019, 10:03 AM 70 mmHg 49 mmHg 57 mmHg 72 bpm

## 2019-07-07 ENCOUNTER — Encounter: Admit: 2019-07-07 | Discharge: 2019-07-07

## 2019-07-11 ENCOUNTER — Encounter: Admit: 2019-07-11 | Discharge: 2019-07-11

## 2019-07-11 MED FILL — FARXIGA 10 MG PO TAB: 10 mg | ORAL | 30 days supply | Qty: 90 | Fill #2 | Status: AC

## 2019-07-14 ENCOUNTER — Encounter: Admit: 2019-07-14 | Discharge: 2019-07-14

## 2019-07-16 ENCOUNTER — Encounter: Admit: 2019-07-16 | Discharge: 2019-07-16

## 2019-07-17 ENCOUNTER — Encounter: Admit: 2019-07-17 | Discharge: 2019-07-17

## 2019-07-17 LAB — COVID-19 (SARS-COV-2) PCR

## 2019-07-18 ENCOUNTER — Encounter: Admit: 2019-07-18 | Discharge: 2019-07-18

## 2019-07-18 NOTE — Telephone Encounter
Patient notified CardioMEMS coordinator that he would not be able to come for his right heart catheterization and re-calibration on [id number] due to conflicts with having to take his grandchildren to school. Reviewed the process of the re-calibration with the patient multiple times, patient states he will look at the school schedule and determine when he can reschedule and will contact us to reschedule the procedure.

## 2019-07-18 NOTE — Telephone Encounter
-----   Message from Monna Fam, RN sent at 07/18/2019  8:09 AM CDT -----  Regarding: needs to reschedule  I just got off the phone with Remo Lipps.  He is scheduled for a RHC with Cardiomems recalibration with [facility] tomorrow 10/14.  I gave him an arrival time of 0730, but he says he cannot be here before 0930 as he needs to take his kids to school and then said he needs to be discharged in time to be back in [place], [place] by 2 o'clock in the afternoon.  I told him that we could work with him for a later arrival time however, I could not guarantee him a procedure start time or completion, or discharge time.  Because of this, he says he needs to reschedule this procedure on a day when he does not have kids to drop off and pick up.    The patient asks that someone from B Gupta's office call him so this can be rescheduled.    Please send scheduling a change form/case message so we can move this case off the schedule for tomorrow and/or schedule it for a different day.    Sharee Pimple

## 2019-07-19 ENCOUNTER — Encounter: Admit: 2019-07-19 | Discharge: 2019-07-19

## 2019-07-19 DIAGNOSIS — Z95810 Presence of automatic (implantable) cardiac defibrillator: Secondary | ICD-10-CM

## 2019-07-19 DIAGNOSIS — I5022 Chronic systolic (congestive) heart failure: Secondary | ICD-10-CM

## 2019-07-19 DIAGNOSIS — I420 Dilated cardiomyopathy: Secondary | ICD-10-CM

## 2019-07-19 DIAGNOSIS — Z1159 Encounter for screening for other viral diseases: Secondary | ICD-10-CM

## 2019-07-19 MED ORDER — TOUJEO SOLOSTAR U-300 INSULIN 300 UNIT/ML (1.5 ML) SC INPN
30 [IU] | Freq: Every day | INTRAMUSCULAR | 1 refills | Status: CN
Start: 2019-07-19 — End: ?

## 2019-07-19 MED ORDER — SIMVASTATIN 20 MG PO TAB
20 mg | ORAL_TABLET | Freq: Every evening | ORAL | 8 refills | Status: DC
Start: 2019-07-19 — End: 2020-01-09
  Filled 2019-07-18: qty 30, 30d supply, fill #1

## 2019-07-20 ENCOUNTER — Encounter: Admit: 2019-07-20 | Discharge: 2019-07-20

## 2019-07-20 MED FILL — PEN NEEDLE, DIABETIC 31 GAUGE X 5/16" MISC NDLE: 31 gauge x 5/16" | 25 days supply | Qty: 100 | Fill #2 | Status: AC

## 2019-07-20 MED FILL — TOUJEO MAX U-300 SOLOSTAR 300 UNIT/ML (3 ML) SC INPN: 300 unit/mL (3 mL) | SUBCUTANEOUS | 31 days supply | Qty: 60 | Fill #4 | Status: AC

## 2019-07-26 ENCOUNTER — Encounter: Admit: 2019-07-26 | Discharge: 2019-07-26

## 2019-07-26 NOTE — Telephone Encounter
Patient called requesting COVID 19 Test Results.  Patient was tested at [facility] and his results recently scanned into 02.  Patient informed of negative test result.    Demetrius Charity, BSN, RN

## 2019-07-28 ENCOUNTER — Encounter: Admit: 2019-07-28 | Discharge: 2019-07-28

## 2019-07-28 MED FILL — PREGABALIN 200 MG PO CAP: 200 mg | ORAL | 13 days supply | Qty: 270 | Fill #4 | Status: AC

## 2019-08-02 ENCOUNTER — Encounter: Admit: 2019-08-02 | Discharge: 2019-08-02

## 2019-08-02 MED ORDER — APIXABAN 5 MG PO TAB
5 mg | ORAL_TABLET | Freq: Two times a day (BID) | ORAL | 1 refills | Status: CN
Start: 2019-08-02 — End: ?

## 2019-08-03 ENCOUNTER — Encounter: Admit: 2019-08-03 | Discharge: 2019-08-03

## 2019-08-03 MED ORDER — BUPROPION SR 200 MG PO TBSR
200 mg | ORAL_TABLET | Freq: Two times a day (BID) | ORAL | 4 refills | Status: CN
Start: 2019-08-03 — End: ?

## 2019-08-05 ENCOUNTER — Encounter: Admit: 2019-08-05 | Discharge: 2019-08-05

## 2019-08-05 MED ORDER — PEN NEEDLE, DIABETIC 31 GAUGE X 5/16" MISC NDLE
1 refills | Status: CN
Start: 2019-08-05 — End: ?

## 2019-08-07 ENCOUNTER — Encounter: Admit: 2019-08-07 | Discharge: 2019-08-07

## 2019-08-07 MED ORDER — INSULIN ASPART 100 UNIT/ML SC FLEXPEN
20 [IU] | Freq: Three times a day (TID) | SUBCUTANEOUS | 3 refills | Status: CN
Start: 2019-08-07 — End: ?

## 2019-08-07 MED ORDER — TOUJEO SOLOSTAR U-300 INSULIN 300 UNIT/ML (1.5 ML) SC INPN
60 [IU] | Freq: Every day | SUBCUTANEOUS | 3 refills | 68.00000 days | Status: DC
Start: 2019-08-07 — End: 2019-12-01
  Filled 2019-08-08: qty 58, 22d supply, fill #1

## 2019-08-07 MED ORDER — GABAPENTIN 300 MG PO CAP
900 mg | ORAL_CAPSULE | Freq: Four times a day (QID) | ORAL | 3 refills | Status: AC
Start: 2019-08-07 — End: ?
  Filled 2019-08-08: qty 1080, 30d supply, fill #1

## 2019-08-07 NOTE — Telephone Encounter
Pt called reporting concerns with area around ICD site. Called to assess:       Signs and Symptoms  "Lump has gotten bigger there" For last 8 months.   Redness and bruising recently, pt not able to verify when he first noticed.   No pain in area or symptoms.

## 2019-08-07 NOTE — Telephone Encounter
Called and spoke to patient. He states there has been no trauma to the area. Patient reports that the swelling is up about 1/2 in, the bruising is Black and blue. Patient denies any pain or feeling like the device has shocked him. Patient cannot confirm when he noticed the swelling or bruising. Will route to [facility] for recommendation.

## 2019-08-08 ENCOUNTER — Encounter: Admit: 2019-08-08 | Discharge: 2019-08-08

## 2019-08-09 ENCOUNTER — Encounter: Admit: 2019-08-09 | Discharge: 2019-08-09

## 2019-08-09 ENCOUNTER — Ambulatory Visit: Admit: 2019-08-09 | Discharge: 2019-08-10 | Payer: MEDICAID

## 2019-08-09 DIAGNOSIS — K220 Achalasia of cardia: Secondary | ICD-10-CM

## 2019-08-09 DIAGNOSIS — I509 Heart failure, unspecified: Secondary | ICD-10-CM

## 2019-08-09 DIAGNOSIS — I429 Cardiomyopathy, unspecified: Secondary | ICD-10-CM

## 2019-08-09 DIAGNOSIS — F329 Major depressive disorder, single episode, unspecified: Secondary | ICD-10-CM

## 2019-08-09 DIAGNOSIS — E119 Type 2 diabetes mellitus without complications: Secondary | ICD-10-CM

## 2019-08-09 DIAGNOSIS — I739 Peripheral vascular disease, unspecified: Secondary | ICD-10-CM

## 2019-08-09 DIAGNOSIS — C439 Malignant melanoma of skin, unspecified: Secondary | ICD-10-CM

## 2019-08-09 NOTE — Progress Notes
Date of Service: 08/09/2019    Subjective:             Larry Pena is a 57 y.o. male.    History of Present Illness  This is  a 58 year old Caucasian male with history of Type II achalasia who returns for follow-up.  At his last visit he had wanted to see thoracic surgery to discuss Heller myotomy.  However he did not follow-up with this.  He continues to have dysphagia, regurgitation, choking episodes at night and some weight loss.  His Eckardt score is 7/12.  He is on Eliquis and aspirin.     He wants to discuss further options for treating achalasia           Review of Systems   Musculoskeletal: Positive for arthralgias, back pain and gait problem.        Bilateral hand and foot pain   Comprehensive 10 point ROS taken, and otherwise negative except as above.    Active Ambulatory Problems     Diagnosis Date Noted   ? Type 2 diabetes mellitus with foot ulcer (CODE) (HCC) 06/18/2015   ? Dyslipidemia 06/18/2015   ? Hypertension 06/18/2015   ? Diabetic neuropathy (HCC) 06/18/2015   ? Syncope 06/18/2015   ? Chronic systolic heart failure (HCC) 06/18/2015   ? Achalasia of esophagus 06/21/2015   ? Cardiac resynchronization therapy defibrillator (CRT-D) in place 06/21/2015   ? Community acquired pneumonia 08/06/2015   ? Dilated cardiomyopathy (HCC) 12/04/2015   ? Acute on chronic systolic (congestive) heart failure (HCC) 05/25/2017   ? Paroxysmal atrial fibrillation (HCC) 05/27/2017   ? Cellulitis of right lower limb 05/27/2017   ? Stage 3 chronic kidney disease 05/27/2017   ? Depression 05/27/2017   ? Diabetic polyneuropathy associated with type 2 diabetes mellitus (HCC) 05/27/2017   ? Pain of foot 05/27/2017   ? History of MI (myocardial infarction) 05/27/2017   ? Low back pain 05/27/2017   ? Radiculopathy, lumbar region 05/27/2017   ? 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 Sepsis (HCC) 10/09/2017   ? Acute respiratory failure with hypoxia (HCC) 10/09/2017   ? Parkinson disease (HCC) 03/18/2018 Resolved Ambulatory Problems     Diagnosis Date Noted   ? Acute on chronic combined systolic (congestive) and diastolic (congestive) heart failure (HCC) 10/06/2017     Past Medical History:   Diagnosis Date   ? Cardiomyopathy (HCC)    ? CHF (congestive heart failure) (HCC)    ? Diabetes (HCC)    ? Melanoma (HCC) 2016   ? Peripheral vascular disease (HCC)        Social History     Socioeconomic History   ? Marital status: Divorced     Spouse name: Not on file   ? Number of children: Not on file   ? Years of education: Not on file   ? Highest education level: Not on file   Occupational History   ? Not on file   Tobacco Use   ? Smoking status: Never Smoker   ? Smokeless tobacco: Never Used   Substance and Sexual Activity   ? Alcohol use: No   ? Drug use: No   ? Sexual activity: Not on file   Other Topics Concern   ? Not on file   Social History Narrative   ? Not on file       Family History   Problem Relation Age of Onset   ? Heart Disease Mother    ?  Heart Failure Mother    ? Cancer Father    ? Sudden Cardiac Death Paternal Grandfather    ? Heart Failure Brother        No Known Allergies    Patient Active Problem List    Diagnosis Date Noted   ? Parkinson disease (HCC) 03/18/2018   ? Sepsis (HCC) 10/09/2017   ? Acute respiratory failure with hypoxia (HCC) 10/09/2017   ? 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 Paroxysmal atrial fibrillation (HCC) 05/27/2017   ? Cellulitis of right lower limb 05/27/2017   ? Stage 3 chronic kidney disease 05/27/2017   ? Depression 05/27/2017   ? Diabetic polyneuropathy associated with type 2 diabetes mellitus (HCC) 05/27/2017   ? Pain of foot 05/27/2017   ? History of MI (myocardial infarction) 05/27/2017   ? Low back pain 05/27/2017   ? Radiculopathy, lumbar region 05/27/2017   ? Acute on chronic systolic (congestive) heart failure (HCC) 05/25/2017   ? Dilated cardiomyopathy (HCC) 12/04/2015   ? Community acquired pneumonia 08/06/2015   ? Achalasia of esophagus 06/21/2015 ? Cardiac resynchronization therapy defibrillator (CRT-D) in place 06/21/2015   ? Type 2 diabetes mellitus with foot ulcer (CODE) (HCC) 06/18/2015   ? Dyslipidemia 06/18/2015   ? Hypertension 06/18/2015   ? Diabetic neuropathy (HCC) 06/18/2015   ? Syncope 06/18/2015   ? Chronic systolic heart failure (HCC) 06/18/2015         Objective:         ? albuterol sulfate (PROAIR HFA) 90 mcg/actuation aerosol inhaler Inhale two puffs by mouth into the lungs four times daily. Shake well before use.   ? amiodarone (CORDARONE) 200 mg tablet Take one tablet by mouth daily.   ? AMITR/GABAPEN/EMU OIL 01/06/09% CREAM (COMPOUND) Apply topically 2-3 times daily to the affected area.   ? apixaban (ELIQUIS) 5 mg tablet Take one tablet by mouth twice daily.   ? aspirin EC 81 mg tablet Take 81 mg by mouth daily. Take with food.    ? BD ULTRA-FINE SHORT PEN NEEDLE 31 gauge x 5/16 pen needle Use as directed   ? blood sugar diagnostic (TRUE METRIX GLUCOSE TEST STRIP) test strip Use one strip as directed four times daily to test blood sugar.   ? blood sugar diagnostic test strip Use  as directed four times daily.   ? blood-glucose meter (TRUE METRIX GLUCOSE METER) kit Use as directed.   ? buPROPion SR (WELLBUTRIN) 200 mg tablet Take one tablet by mouth twice daily.   ? carvediloL (COREG) 6.25 mg tablet Take one tablet by mouth twice daily.   ? cholecalciferol (VITAMIN D3) 50 mcg (2,000 unit) tablet Take one tablet by mouth daily.   ? collagenase (SANTYL) 250 unit/g topical ointment Apply topically to wound every 24 hours as directed.   ? cyclobenzaprine (FLEXERIL) 10 mg tablet Take one tablet by mouth three times daily as needed for Muscle Cramps.   ? dapagliflozin (FARXIGA) 10 mg tablet Take one tablet by mouth every morning.   ? duloxetine DR (CYMBALTA) 30 mg capsule Take 1 capsule by mouth daily for 1 week, then 2 capsules  daily thereafter.   ? gabapentin (NEURONTIN) 300 mg capsule Take three capsules by mouth four times daily. ? HYDROcodone/acetaminophen(+) (NORCO) 10/325 mg tablet Take 1 tablet by mouth every 6 hours as needed for Pain   ? insulin aspart U-100 (NOVOLOG FLEXPEN U-100 INSULIN) 100 unit/mL injection PEN Inject 16 Units under the skin three times daily with meals.   ?  insulin aspart U-100 (NOVOLOG FLEXPEN) 100 unit/mL (3 mL) injection PEN Inject twenty Units under the skin three times daily with meals.   ? insulin aspart U-100 (NOVOLOG FLEXPEN) 100 unit/mL (3 mL) injection PEN Inject fourteen Units under the skin three times daily with meals.   ? insulin aspart U-100 (NOVOLOG FLEXPEN) 100 unit/mL (3 mL) injection PEN Inject 16 units subcutaneously three times daily before meals *Keep appointment with endocrinology   ? insulin glargine (TOUJEO SOLOSTAR) 300 unit/mL (1.5 mL) injectable Inject 40 Units under the skin at bedtime daily.   ? insulin glargine U-300 conc (TOUJEO MAX U-300 SOLOSTAR) 300 unit/mL (3 mL) inpn Inject 50 Units under the skin at bedtime daily.   ? insulin glargine U-300 conc (TOUJEO SOLOSTAR U-300 INSULIN) 300 unit/mL (1.5 mL) injectable Inject sixty Units under the skin daily.   ? insulin glargine U-300 conc (TOUJEO SOLOSTAR U-300 INSULIN) 300 unit/mL (1.5 mL) injectable Inject thirty Units under the skin as directed daily.   ? insulin pen needles (disposable) (B-D SHORT) 31 gauge x 5/16 pen needle Use  as directed.   ? insulin pen needles (disposable) (UNIFINE PENTIPS) 31 gauge x 5/16 pen needle Use with insulin as directed four times daily.   ? lancets 33 gauge (TRUEPLUS LANCETS) 33 gauge Use as directed to test blood sugar four times daily.   ? lancets 33 gauge 33 gauge Use 1 each as directed four times daily.   ? metOLazone (ZAROXOLYN) 2.5 mg tablet Take one tablet by mouth as Needed. **TAKE ONLY WHEN DIRECTED BY HF TEAM** Take with *extra* 20 meq of potassium.   ? nitroglycerin (NITROSTAT) 0.4 mg tablet Place one tablet under tongue every 5 minutes as needed for chest pain. ? omeprazole DR (PRILOSEC) 40 mg capsule Take one capsule by mouth daily.   ? potassium chloride (KLOR-CON) 10 mEq tablet Take five tablets by mouth twice daily.   ? pregabalin (LYRICA) 150 mg capsule Take 150 mg by mouth three times daily.   ? pregabalin (LYRICA) 200 mg capsule Take one capsule by mouth three times daily.   ? pregabalin (LYRICA) 200 mg capsule Take one capsule by mouth three times daily.   ? PROAIR HFA 90 mcg/actuation inhaler Inhale 2 puffs by mouth into the lungs twice daily.   ? simvastatin (ZOCOR) 20 mg tablet Take one tablet by mouth at bedtime daily.   ? sitaGLIPtin (JANUVIA) 100 mg tab tablet Take 100 mg by mouth daily.   ? spironolactone (ALDACTONE) 25 mg tablet Take one tablet by mouth daily. Take with food.   ? torsemide (DEMADEX) 20 mg tablet Take five tablets by mouth twice daily.   ? TRUE METRIX GLUCOSE TEST STRIP test strip Use as directed   ? UNILET LANCET 33 gauge Use as directed     Vitals:    08/09/19 1326   BP: 128/70   BP Source: Arm, Right Upper   Patient Position: Sitting   Pulse: 80   Temp: 36.7 ?C (98.1 ?F)   TempSrc: Temporal   Weight: 121.6 kg (268 lb)   Height: 182.9 cm (72)   PainSc: Eight     Body mass index is 36.35 kg/m?Marland Kitchen     Physical Exam  General appearance  alert, cooperative, no distress, appears stated age   Head  Normocephalic, without obvious abnormality, atraumatic   Eyes  conjunctivae/corneas clear. EOM's intact. pupils equally round, accommodation normal.   Nose Nares normal. No drainage or sinus tenderness.   Throat Lips, mucosa, and  tongue normal. Teeth and gums normal   Neck supple, symmetrical, trachea midline, and no JVD   Back   symmetric, no curvature. ROM normal. No CVA tenderness   Lungs   clear to auscultation bilaterally   Chest wall  no tenderness   Heart  regular rate and rhythm, S1, S2 normal, no murmur, click, rub or gallop   Abdomen   soft, non-tender. Bowel sounds normal. No masses,  No organomegaly Extremities extremities normal, atraumatic, no cyanosis or edema   Pulses 2+ and symmetric   Skin Skin color, texture, turgor normal. No rashes or lesions   Neurologic Normal        Assessment and Plan:    This is  a 58 year old Caucasian male with history of Type II achalasia who returns for follow-up.  At his last visit he had wanted to see thoracic surgery to discuss Heller myotomy.  However he did not follow-up with this.  He continues to have dysphagia, regurgitation, choking episodes at night and some weight loss.  His Eckardt score is 7/12.  He is on Eliquis and aspirin.   He wants to discuss further options for treating achalasia    I discussed with the patient the pathophysiology, clinical manifestation and the management of achalasia in detail.  This discussion was conducted over more than 35 minutes.  I made diagrams to explain both the pathophysiology as well as the management of achalasia.  The different treatment options that include Botox injection, pneumatic dilation, Heller myotomy as well as per oral endoscopic myotomy were discussed.  The pros and cons of each approach were discussed.  We specifically discussed in detail regarding per oral endoscopic myotomy.  I made diagrams to explain the entire procedure.  The complications involved that include chest pain, infection, bleeding, perforation, emergency and need for surgery at etc. were discussed.  I also discussed the main differences between [facility] myotomy as well as per oral endoscopic myotomy.  I explained to the patient that if he wants to opt for [facility] myotomy then we will refer him to 1 of our thoracic surgeons.  If however he wants to opt for per oral endoscopic myotomy then we will proceed with an endoscopic ultrasound to evaluate the distal esophagus, rule out pseudo-achalasia and evaluate the muscularis propria for its thickness.  We will then proceed with applying for preauthorization from his insurance carrier.  Once this is approved he will be scheduled for the procedure.  After this extensive discussion patient decided that he wanted to proceed with bilateral endoscopic myotomy.  We will start the paperwork with his insurance company.  Once the procedure is approved and will be scheduled.  He is on Eliquis and aspirin and will have to hold the latter for 7 days in the form of likely 3 days prior to the procedure.  He will discuss with the prescribing physician regarding this.  In the meantime I advised him to chew food well and swallow 1 bite at a time.  He should drink liquids to push food down and stay upright at least for 4 hours after eating or drinking anything.    Follow-up on an as-needed basis.  Thank you for letting us to participate in his care and please feel free to call us if you have any questions.

## 2019-08-09 NOTE — Patient Instructions
Will start paper work to approve POEM by insurance    Please let me know if you have any questions or concerns.    Thank you,  Merleen Nicely RN  Gastroenterology   [phone]

## 2019-08-10 ENCOUNTER — Encounter: Admit: 2019-08-10 | Discharge: 2019-08-10

## 2019-08-10 MED ORDER — PEN NEEDLE, DIABETIC 31 GAUGE X 5/16" MISC NDLE
1 refills | Status: CN
Start: 2019-08-10 — End: ?

## 2019-08-10 MED ORDER — BUPROPION SR 200 MG PO TBSR
200 mg | ORAL_TABLET | Freq: Two times a day (BID) | ORAL | 4 refills | Status: CN
Start: 2019-08-10 — End: ?

## 2019-08-10 MED ORDER — ELIQUIS 5 MG PO TAB
5 mg | ORAL_TABLET | Freq: Two times a day (BID) | ORAL | 1 refills | Status: CN
Start: 2019-08-10 — End: ?

## 2019-08-10 MED FILL — FARXIGA 10 MG PO TAB: 10 mg | ORAL | 30 days supply | Qty: 90 | Fill #3 | Status: AC

## 2019-08-11 ENCOUNTER — Encounter: Admit: 2019-08-11 | Discharge: 2019-08-11 | Primary: Family

## 2019-08-11 ENCOUNTER — Ambulatory Visit: Admit: 2019-08-11 | Discharge: 2019-08-11 | Payer: MEDICAID | Primary: Family

## 2019-08-11 ENCOUNTER — Ambulatory Visit: Admit: 2019-08-11 | Discharge: 2019-08-11 | Primary: Family

## 2019-08-11 DIAGNOSIS — I429 Cardiomyopathy, unspecified: Secondary | ICD-10-CM

## 2019-08-11 DIAGNOSIS — C439 Malignant melanoma of skin, unspecified: Secondary | ICD-10-CM

## 2019-08-11 DIAGNOSIS — I480 Paroxysmal atrial fibrillation: Secondary | ICD-10-CM

## 2019-08-11 DIAGNOSIS — I509 Heart failure, unspecified: Secondary | ICD-10-CM

## 2019-08-11 DIAGNOSIS — F329 Major depressive disorder, single episode, unspecified: Secondary | ICD-10-CM

## 2019-08-11 DIAGNOSIS — I5022 Chronic systolic (congestive) heart failure: Secondary | ICD-10-CM

## 2019-08-11 DIAGNOSIS — I420 Dilated cardiomyopathy: Secondary | ICD-10-CM

## 2019-08-11 DIAGNOSIS — I739 Peripheral vascular disease, unspecified: Secondary | ICD-10-CM

## 2019-08-11 DIAGNOSIS — Z95810 Presence of automatic (implantable) cardiac defibrillator: Secondary | ICD-10-CM

## 2019-08-11 DIAGNOSIS — E119 Type 2 diabetes mellitus without complications: Secondary | ICD-10-CM

## 2019-08-11 MED ORDER — PEN NEEDLE, DIABETIC 31 GAUGE X 5/16" MISC NDLE
1 | Freq: Four times a day (QID) | 2 refills | 90.00000 days | Status: AC
Start: 2019-08-11 — End: ?
  Filled 2019-08-11: qty 100, 25d supply, fill #1
  Filled 2019-08-25: qty 200, 25d supply, fill #2

## 2019-08-11 MED ORDER — TORSEMIDE 100 MG PO TAB
100 mg | ORAL_TABLET | Freq: Two times a day (BID) | ORAL | 1 refills | 67.50000 days | Status: DC
Start: 2019-08-11 — End: 2019-11-23
  Filled 2019-08-11: qty 180, 30d supply, fill #1

## 2019-08-11 NOTE — Telephone Encounter
Mr. Jarboe is a 58 year old male who complained of dysphagia for 4 years and was recently diagnosed with achalasia.  I discussed the different treatment options for achalasia with him and he has opted for per-oral endoscopic myotomy.    Per-oral Endoscopic Myotomy (POEM) is a new treatment option for achalasia. In this procedure myotomy is endoscopically performed in the lower esophagus and at the gastro-esophageal junction. Therefore, patients are able to avoid surgery as the conventional treatment for achalasia has been Heller?s myotomy. Over the past 5 years POEM has thrived and gained worldwide acceptance as a treatment for achalasia. It is being performed at many centers in the [place] and is becoming the first line treatment for achalasia.    POEM has been shown to be very effective in relieving symptoms of achalasia. The therapeutic success rate from different studies has been > 80%. Patients have shown a dramatic improvement in the Eckardt score and significant decline in the pressures at the lower esophageal sphincter. Compared to Heller?s myotomy, POEM has shown favorable results in different cohorts of patients from the [place] and [place]. There is less pain after the procedure, faster return to activities of daily living, lower post-operative Eckardt score, lower esophageal sphincter pressure, shorter hospital stay, less dysphagia at 6 months post treatment. Moreover, it is a preferred treatment option over Heller myotomy for Achalsia Type III as a longer myotomy can be performed with POEM.    More importantly, POEM is a safe procedure. The adverse events are self limited and occur in <10% cases. There has been negligible significant morbidity and no mortality.     We have performed several POEM procedures here at [facility] over the last 3 years with excellent success. It proves to be a cost saving endeavor compared to Heller?s myotomy because it is performed in the endoscopy lab and thereby avoids OR costs. It requires less equipment and less intense post-operative care and shorter hospital stay.    As is the case with any newer procedure, development and approval of new CPT codes will take a few years. However, to not be able to offer POEM to patients with achalasia because of lack of a CPT code or due to denial from [employer], is a Musician, in my opinion. We are depriving this patient of a safer, less painful and more successful option for achalasia.    I am attaching recent publications in the literature supporting POEM as a treatment option for achalasia.    Therefore, it is my request that this procedure be approved for Mr. Lehenbauer.    Sincerely,            Vertell Novak, MD, FASGE  Professor of Medicine  [facility]  Email ? [email or web address]    Signed at emailed to pre cert department

## 2019-08-14 ENCOUNTER — Ambulatory Visit: Admit: 2019-08-14 | Discharge: 2019-08-14 | Primary: Family

## 2019-08-14 ENCOUNTER — Encounter: Admit: 2019-08-14 | Discharge: 2019-08-14 | Primary: Family

## 2019-08-14 DIAGNOSIS — I739 Peripheral vascular disease, unspecified: Secondary | ICD-10-CM

## 2019-08-14 DIAGNOSIS — I5022 Chronic systolic (congestive) heart failure: Secondary | ICD-10-CM

## 2019-08-14 MED ORDER — PERFLUTREN LIPID MICROSPHERES 1.1 MG/ML IV SUSP
1-20 mL | Freq: Once | INTRAVENOUS | 0 refills | Status: CP | PRN
Start: 2019-08-14 — End: ?

## 2019-08-14 NOTE — Progress Notes
It is good that you do not have any blood flow problem to her lower extremities probably your diabetes is contributing to your leg pain.

## 2019-08-14 NOTE — Telephone Encounter
Larry Redo, MD  P Cvm Nurse Hf Team [place]              It is good that you do not have any blood flow problem to her lower extremities probably your diabetes is contributing to your leg pain.      Call to pt no answer LM

## 2019-08-15 ENCOUNTER — Encounter: Admit: 2019-08-15 | Discharge: 2019-08-15 | Primary: Family

## 2019-08-15 NOTE — Telephone Encounter
Call to pt LM ABI and Dr Steve Rattler response.  CB # left

## 2019-08-15 NOTE — Telephone Encounter
-----   Message from Luther Redo, MD sent at 08/14/2019  2:30 PM CST -----  It is good that you do not have any blood flow problem to her lower extremities probably your diabetes is contributing to your leg pain.

## 2019-08-16 ENCOUNTER — Encounter: Admit: 2019-08-16 | Discharge: 2019-08-16 | Primary: Family

## 2019-08-16 MED ORDER — CHOLECALCIFEROL (VITAMIN D3) 25 MCG (1,000 UNIT) PO CAP
1 | ORAL_CAPSULE | ORAL | 11 refills | 84.00000 days | Status: DC
Start: 2019-08-16 — End: 2019-11-23

## 2019-08-16 MED ORDER — BUPROPION SR 200 MG PO TBSR
200 mg | ORAL_TABLET | Freq: Two times a day (BID) | ORAL | 4 refills | Status: CN
Start: 2019-08-16 — End: ?

## 2019-08-17 ENCOUNTER — Encounter: Admit: 2019-08-17 | Discharge: 2019-08-17 | Primary: Family

## 2019-08-18 ENCOUNTER — Encounter: Admit: 2019-08-18 | Discharge: 2019-08-18 | Primary: Family

## 2019-08-18 MED ORDER — BUPROPION SR 200 MG PO TBSR
200 mg | ORAL_TABLET | Freq: Two times a day (BID) | ORAL | 7 refills | Status: DC
Start: 2019-08-18 — End: 2019-11-23
  Filled 2019-08-18: qty 60, 30d supply, fill #1

## 2019-08-20 ENCOUNTER — Encounter: Admit: 2019-08-20 | Discharge: 2019-08-20 | Primary: Family

## 2019-08-20 MED ORDER — OMEPRAZOLE 40 MG PO CPDR
40 mg | ORAL_CAPSULE | Freq: Every day | ORAL | 6 refills | Status: CN
Start: 2019-08-20 — End: ?

## 2019-08-20 MED ORDER — APIXABAN 5 MG PO TAB
5 mg | ORAL_TABLET | Freq: Two times a day (BID) | ORAL | 1 refills | Status: CN
Start: 2019-08-20 — End: ?

## 2019-08-21 MED FILL — PREGABALIN 200 MG PO CAP: 200 mg | ORAL | 13 days supply | Qty: 270 | Fill #5 | Status: AC

## 2019-08-23 ENCOUNTER — Encounter: Admit: 2019-08-23 | Discharge: 2019-08-23 | Primary: Family

## 2019-08-24 ENCOUNTER — Encounter: Admit: 2019-08-24 | Discharge: 2019-08-24 | Primary: Family

## 2019-08-25 ENCOUNTER — Encounter: Admit: 2019-08-25 | Discharge: 2019-08-25 | Primary: Family

## 2019-08-25 MED FILL — LANCETS 33 GAUGE MISC MISC: 33 gauge | 25 days supply | Qty: 200 | Fill #2 | Status: AC

## 2019-09-02 ENCOUNTER — Encounter: Admit: 2019-09-02 | Discharge: 2019-09-02 | Primary: Family

## 2019-09-04 MED ORDER — SANTYL 250 UNIT/GRAM TP OINT
TOPICAL | 0 refills | Status: AC
Start: 2019-09-04 — End: ?
  Filled 2019-09-07: qty 30, 14d supply, fill #1

## 2019-09-05 ENCOUNTER — Encounter: Admit: 2019-09-05 | Discharge: 2019-09-05 | Primary: Family

## 2019-09-05 NOTE — Progress Notes
The Prior Authorization for Annitta Needs was submitted for [facility] via fax to his non [facility] provider.  Will continue to follow.    [place] Patient Advocate  [phone]

## 2019-09-06 ENCOUNTER — Encounter: Admit: 2019-09-06 | Discharge: 2019-09-06 | Primary: Family

## 2019-09-07 ENCOUNTER — Encounter: Admit: 2019-09-07 | Discharge: 2019-09-07 | Primary: Family

## 2019-09-07 NOTE — Progress Notes
The Prior Authorization for Larry Pena was approved for [employer].  The copay is $0. The medication will be delivered to patient's prescription address per the patient's request.    [place] Patient Advocate  [phone]

## 2019-09-08 ENCOUNTER — Encounter: Admit: 2019-09-08 | Discharge: 2019-09-08 | Primary: Family

## 2019-09-08 MED ORDER — ELIQUIS 5 MG PO TAB
5 mg | ORAL_TABLET | Freq: Two times a day (BID) | ORAL | 5 refills | 30.00000 days | Status: DC
Start: 2019-09-08 — End: 2019-11-23
  Filled 2019-09-08: qty 60, 30d supply, fill #1

## 2019-09-08 MED ORDER — OMEPRAZOLE 40 MG PO CPDR
40 mg | ORAL_CAPSULE | Freq: Every day | ORAL | 5 refills | 90.00000 days | Status: DC
Start: 2019-09-08 — End: 2019-11-23
  Filled 2019-09-08: qty 30, 90d supply, fill #1

## 2019-09-08 MED ORDER — PEN NEEDLE, DIABETIC 31 GAUGE X 5/16" MISC NDLE
5 refills | 30.00000 days | Status: AC
Start: 2019-09-08 — End: ?
  Filled 2019-09-08: qty 100, 25d supply, fill #1

## 2019-09-10 ENCOUNTER — Encounter: Admit: 2019-09-10 | Discharge: 2019-09-10 | Primary: Family

## 2019-09-11 MED FILL — PREGABALIN 200 MG PO CAP: 200 mg | ORAL | 13 days supply | Qty: 270 | Fill #6 | Status: AC

## 2019-09-15 ENCOUNTER — Encounter: Admit: 2019-09-15 | Discharge: 2019-09-15 | Primary: Family

## 2019-09-15 MED FILL — BUPROPION SR 200 MG PO TBSR: 200 mg | ORAL | 30 days supply | Qty: 60 | Fill #2 | Status: AC

## 2019-09-16 ENCOUNTER — Encounter: Admit: 2019-09-16 | Discharge: 2019-09-16 | Primary: Family

## 2019-09-17 ENCOUNTER — Encounter: Admit: 2019-09-17 | Discharge: 2019-09-17 | Primary: Family

## 2019-09-17 MED FILL — GABAPENTIN 300 MG PO CAP: 300 mg | ORAL | 30 days supply | Qty: 1080 | Fill #2 | Status: AC

## 2019-09-18 ENCOUNTER — Encounter: Admit: 2019-09-18 | Discharge: 2019-09-18 | Primary: Family

## 2019-09-18 MED FILL — INSULIN ASPART 100 UNIT/ML SC FLEXPEN: 100 unit/mL (3 mL) | SUBCUTANEOUS | 31 days supply | Qty: 45 | Fill #4 | Status: AC

## 2019-09-19 ENCOUNTER — Encounter: Admit: 2019-09-19 | Discharge: 2019-09-19 | Primary: Family

## 2019-09-19 MED FILL — POTASSIUM CHLORIDE 10 MEQ PO TBER: 10 mEq | ORAL | 30 days supply | Qty: 300 | Fill #2 | Status: AC

## 2019-09-21 ENCOUNTER — Encounter: Admit: 2019-09-21 | Discharge: 2019-09-21 | Primary: Family

## 2019-09-21 NOTE — Telephone Encounter
Called patient to discuss recommendations from Dr. Candi Leash. No answer. VM left with recommendations to come to ER if unable to eat or drink. Number left for patient to call back

## 2019-10-01 ENCOUNTER — Encounter: Admit: 2019-10-01 | Discharge: 2019-10-01 | Primary: Family

## 2019-10-01 MED FILL — FARXIGA 10 MG PO TAB: 10 mg | ORAL | 30 days supply | Qty: 90 | Fill #5 | Status: AC

## 2019-10-01 MED FILL — CARVEDILOL 6.25 MG PO TAB: 6.25 mg | ORAL | 90 days supply | Qty: 180 | Fill #3 | Status: AC

## 2019-10-01 MED FILL — PREGABALIN 200 MG PO CAP: 200 mg | ORAL | 30 days supply | Qty: 270 | Fill #7 | Status: AC

## 2019-10-02 ENCOUNTER — Encounter: Admit: 2019-10-02 | Discharge: 2019-10-02 | Primary: Family

## 2019-10-03 ENCOUNTER — Encounter: Admit: 2019-10-03 | Discharge: 2019-10-03 | Primary: Family

## 2019-10-03 MED FILL — TOUJEO SOLOSTAR U-300 INSULIN 300 UNIT/ML (1.5 ML) SC INPN: 300 unit/mL (1.5 mL) | SUBCUTANEOUS | 22 days supply | Qty: 58 | Fill #2 | Status: AC

## 2019-10-24 ENCOUNTER — Encounter: Admit: 2019-10-24 | Discharge: 2019-10-24 | Primary: Family

## 2019-10-31 ENCOUNTER — Encounter: Admit: 2019-10-31 | Discharge: 2019-10-31 | Primary: Family

## 2019-11-01 ENCOUNTER — Encounter: Admit: 2019-11-01 | Discharge: 2019-11-01 | Primary: Family

## 2019-11-02 ENCOUNTER — Encounter: Admit: 2019-11-02 | Discharge: 2019-11-02 | Primary: Family

## 2019-11-02 MED ORDER — CHOLECALCIFEROL (VITAMIN D3) 25 MCG (1,000 UNIT) PO CAP
1 | ORAL_CAPSULE | ORAL | 3 refills | Status: CN
Start: 2019-11-02 — End: ?

## 2019-11-02 MED ORDER — HYDROCODONE-ACETAMINOPHEN 5-325 MG PO TAB
1 | ORAL_TABLET | Freq: Four times a day (QID) | ORAL | 0 refills | 15.00000 days | Status: DC | PRN
Start: 2019-11-02 — End: 2019-11-29

## 2019-11-02 MED ORDER — TRIAMCINOLONE ACETONIDE 0.1 % TP CREA
Freq: Three times a day (TID) | TOPICAL | 0 refills | Status: AC | PRN
Start: 2019-11-02 — End: ?
  Filled 2019-11-02: qty 60, 30d supply, fill #1

## 2019-11-02 MED FILL — BUPROPION SR 200 MG PO TBSR: 200 mg | ORAL | 30 days supply | Qty: 60 | Fill #3 | Status: AC

## 2019-11-02 MED FILL — TOUJEO SOLOSTAR U-300 INSULIN 300 UNIT/ML (1.5 ML) SC INPN: 300 unit/mL (1.5 mL) | SUBCUTANEOUS | 22 days supply | Qty: 58 | Fill #3 | Status: AC

## 2019-11-02 MED FILL — GABAPENTIN 300 MG PO CAP: 300 mg | ORAL | 30 days supply | Qty: 1080 | Fill #3 | Status: AC

## 2019-11-02 MED FILL — AMIODARONE 200 MG PO TAB: 200 mg | ORAL | 30 days supply | Qty: 30 | Fill #2 | Status: AC

## 2019-11-03 ENCOUNTER — Encounter: Admit: 2019-11-03 | Discharge: 2019-11-03 | Primary: Family

## 2019-11-03 MED ORDER — INSULIN ASPART 100 UNIT/ML SC FLEXPEN
20 [IU] | Freq: Three times a day (TID) | SUBCUTANEOUS | 3 refills | Status: CN
Start: 2019-11-03 — End: ?

## 2019-11-03 MED FILL — PEN NEEDLE, DIABETIC 31 GAUGE X 5/16" MISC NDLE: 31 gauge x 5/16" | 25 days supply | Qty: 100 | Fill #2 | Status: AC

## 2019-11-03 MED FILL — FARXIGA 10 MG PO TAB: 10 mg | ORAL | 30 days supply | Qty: 30 | Fill #6 | Status: AC

## 2019-11-04 ENCOUNTER — Encounter: Admit: 2019-11-04 | Discharge: 2019-11-04 | Primary: Family

## 2019-11-04 MED ORDER — INSULIN LISPRO 100 UNIT/ML SC INPN
20 [IU] | Freq: Three times a day (TID) | SUBCUTANEOUS | 3 refills | 30.00000 days | Status: DC
Start: 2019-11-04 — End: 2019-12-01
  Filled 2019-11-06: qty 15, 25d supply, fill #1

## 2019-11-05 ENCOUNTER — Encounter: Admit: 2019-11-05 | Discharge: 2019-11-05 | Primary: Family

## 2019-11-05 MED FILL — SPIRONOLACTONE 25 MG PO TAB: 25 mg | ORAL | 90 days supply | Qty: 90 | Fill #3 | Status: AC

## 2019-11-05 MED FILL — SIMVASTATIN 20 MG PO TAB: 20 mg | ORAL | 90 days supply | Qty: 30 | Fill #2 | Status: AC

## 2019-11-06 ENCOUNTER — Encounter: Admit: 2019-11-06 | Discharge: 2019-11-06 | Primary: Family

## 2019-11-09 ENCOUNTER — Encounter: Admit: 2019-11-09 | Discharge: 2019-11-09 | Primary: Family

## 2019-11-09 NOTE — Telephone Encounter
Spoke with patient, he reports he has not been taking Amiodarone 200 mg for several months. Pt informed this was still on his med list at [place] and I did not see any documentation that it was to be stopped. Pt thinks his nephrologist may have stopped the medication, pt to follow up with nephrology and check back with our office tomorrow.

## 2019-11-09 NOTE — Telephone Encounter
Pt called asking about amiodarone, pt reports he is not  Taking. Called and Lm to cb.

## 2019-11-10 ENCOUNTER — Encounter: Admit: 2019-11-10 | Discharge: 2019-11-10 | Primary: Family

## 2019-11-10 MED FILL — HYDROCODONE-ACETAMINOPHEN 5-325 MG PO TAB: 5/325 mg | ORAL | 7 days supply | Qty: 28 | Fill #1 | Status: AC

## 2019-11-13 ENCOUNTER — Encounter: Admit: 2019-11-13 | Discharge: 2019-11-13 | Primary: Family

## 2019-11-16 ENCOUNTER — Encounter: Admit: 2019-11-16 | Discharge: 2019-11-16 | Primary: Family

## 2019-11-20 ENCOUNTER — Inpatient Hospital Stay: Admit: 2019-11-20 | Discharge: 2019-11-20 | Primary: Family

## 2019-11-20 ENCOUNTER — Encounter: Admit: 2019-11-20 | Discharge: 2019-11-20 | Primary: Family

## 2019-11-20 DIAGNOSIS — K220 Achalasia of cardia: Secondary | ICD-10-CM

## 2019-11-20 DIAGNOSIS — Z20822 Encounter for screening laboratory testing for COVID-19 virus in asymptomatic patient: Secondary | ICD-10-CM

## 2019-11-23 ENCOUNTER — Encounter: Admit: 2019-11-23 | Discharge: 2019-11-23 | Primary: Family

## 2019-11-23 MED ORDER — FARXIGA 10 MG PO TAB
10 mg | ORAL_TABLET | Freq: Every morning | ORAL | 3 refills | Status: CN
Start: 2019-11-23 — End: ?

## 2019-11-23 MED ORDER — CYCLOBENZAPRINE 10 MG PO TAB
10 mg | ORAL_TABLET | Freq: Three times a day (TID) | ORAL | 0 refills | 30.00000 days | Status: AC | PRN
Start: 2019-11-23 — End: ?

## 2019-11-23 MED ORDER — INSULIN ASPART 100 UNIT/ML SC FLEXPEN
14 [IU] | Freq: Three times a day (TID) | SUBCUTANEOUS | 3 refills | 42.00000 days | Status: DC
Start: 2019-11-23 — End: 2019-11-23

## 2019-11-23 MED ORDER — ELIQUIS 5 MG PO TAB
5 mg | ORAL_TABLET | Freq: Two times a day (BID) | ORAL | 0 refills | Status: DC
Start: 2019-11-23 — End: 2020-04-25
  Filled 2019-11-23: qty 60, 30d supply, fill #1

## 2019-11-23 MED ORDER — OMEPRAZOLE 40 MG PO CPDR
40 mg | ORAL_CAPSULE | Freq: Every day | ORAL | 0 refills | Status: AC
Start: 2019-11-23 — End: ?
  Filled 2019-12-26: qty 90, 90d supply, fill #1

## 2019-11-23 MED ORDER — BUPROPION SR 200 MG PO TBSR
200 mg | ORAL_TABLET | Freq: Two times a day (BID) | ORAL | 4 refills | 30.00000 days | Status: AC
Start: 2019-11-23 — End: ?
  Filled 2019-12-07: qty 60, 30d supply, fill #1

## 2019-11-23 MED ORDER — LANCETS 33 GAUGE MISC MISC
Freq: Two times a day (BID) | 0 refills | Status: AC
Start: 2019-11-23 — End: ?

## 2019-11-23 MED ORDER — TORSEMIDE 100 MG PO TAB
100 mg | ORAL_TABLET | Freq: Two times a day (BID) | ORAL | 1 refills | 67.50000 days | Status: AC
Start: 2019-11-23 — End: ?
  Filled 2019-11-23: qty 60, 30d supply, fill #1

## 2019-11-24 ENCOUNTER — Encounter: Admit: 2019-11-24 | Discharge: 2019-11-24 | Primary: Family

## 2019-11-25 ENCOUNTER — Encounter: Admit: 2019-11-25 | Discharge: 2019-11-25 | Primary: Family

## 2019-11-25 MED ORDER — PREGABALIN 150 MG PO CAP
150 mg | ORAL_CAPSULE | Freq: Three times a day (TID) | ORAL | 5 refills | 30.00000 days | Status: DC
Start: 2019-11-25 — End: 2020-01-18
  Filled 2019-11-26: qty 90, 30d supply, fill #1

## 2019-11-26 ENCOUNTER — Encounter: Admit: 2019-11-26 | Discharge: 2019-11-26 | Primary: Family

## 2019-11-27 ENCOUNTER — Encounter: Admit: 2019-11-27 | Discharge: 2019-11-27 | Primary: Family

## 2019-11-27 MED ORDER — TOUJEO SOLOSTAR U-300 INSULIN 300 UNIT/ML (1.5 ML) SC INPN
60 [IU] | Freq: Every day | SUBCUTANEOUS | 3 refills | Status: CN
Start: 2019-11-27 — End: ?

## 2019-11-27 MED ORDER — INSULIN LISPRO 100 UNIT/ML SC INPN
20 [IU] | Freq: Three times a day (TID) | SUBCUTANEOUS | 3 refills | Status: CN
Start: 2019-11-27 — End: ?

## 2019-11-29 ENCOUNTER — Encounter: Admit: 2019-11-29 | Discharge: 2019-11-29 | Primary: Family

## 2019-11-29 MED ORDER — HYDROCODONE-ACETAMINOPHEN 5-325 MG PO TAB
1 | ORAL_TABLET | Freq: Four times a day (QID) | ORAL | 0 refills | 30.00000 days | Status: AC | PRN
Start: 2019-11-29 — End: ?
  Filled 2019-11-29: qty 120, 30d supply, fill #1

## 2019-12-01 ENCOUNTER — Encounter: Admit: 2019-12-01 | Discharge: 2019-12-01 | Primary: Family

## 2019-12-01 MED ORDER — INSULIN LISPRO 100 UNIT/ML SC INPN
20 [IU] | Freq: Three times a day (TID) | SUBCUTANEOUS | 3 refills | 39.00000 days | Status: AC
Start: 2019-12-01 — End: ?
  Filled 2019-12-01: qty 60, 25d supply, fill #1

## 2019-12-01 MED ORDER — TOUJEO SOLOSTAR U-300 INSULIN 300 UNIT/ML (1.5 ML) SC INPN
60 [IU] | Freq: Every day | SUBCUTANEOUS | 3 refills | 68.00000 days | Status: AC
Start: 2019-12-01 — End: ?
  Filled 2019-12-01: qty 4.5, 22d supply, fill #1

## 2019-12-01 MED ORDER — PEN NEEDLE, DIABETIC 31 GAUGE X 5/16" MISC NDLE
1 | Freq: Four times a day (QID) | 3 refills | 30.00000 days | Status: AC
Start: 2019-12-01 — End: ?
  Filled 2019-12-01: qty 400, 25d supply, fill #1

## 2019-12-01 MED ORDER — PEN NEEDLE, DIABETIC 31 GAUGE X 5/16" MISC NDLE
5 refills | Status: CN
Start: 2019-12-01 — End: ?

## 2019-12-02 ENCOUNTER — Encounter: Admit: 2019-12-02 | Discharge: 2019-12-02 | Primary: Family

## 2019-12-04 MED ORDER — AMIODARONE 200 MG PO TAB
200 mg | ORAL_TABLET | Freq: Every day | ORAL | 1 refills
Start: 2019-12-04 — End: ?

## 2019-12-06 ENCOUNTER — Encounter: Admit: 2019-12-06 | Discharge: 2019-12-06 | Primary: Family

## 2019-12-06 MED ORDER — ERGOCALCIFEROL (VITAMIN D2) 1,250 MCG (50,000 UNIT) PO CAP
1 | ORAL_CAPSULE | ORAL | 3 refills | 56.00000 days | Status: AC
Start: 2019-12-06 — End: ?
  Filled 2019-12-07: qty 13, 28d supply, fill #1

## 2019-12-06 MED ORDER — TOUJEO SOLOSTAR U-300 INSULIN 300 UNIT/ML (1.5 ML) SC INPN
54 [IU] | Freq: Every day | SUBCUTANEOUS | 3 refills | 68.00000 days | Status: AC
Start: 2019-12-06 — End: ?
  Filled 2019-12-19: qty 60, 25d supply, fill #1

## 2019-12-06 MED ORDER — PREGABALIN 200 MG PO CAP
200 mg | ORAL_CAPSULE | Freq: Three times a day (TID) | ORAL | 1 refills | Status: AC
Start: 2019-12-06 — End: ?
  Filled 2019-12-07: qty 270, 30d supply, fill #1

## 2019-12-07 ENCOUNTER — Encounter: Admit: 2019-12-07 | Discharge: 2019-12-07 | Primary: Family

## 2019-12-07 MED ORDER — AMIODARONE 200 MG PO TAB
200 mg | ORAL_TABLET | Freq: Every day | ORAL | 1 refills
Start: 2019-12-07 — End: ?

## 2019-12-07 MED ORDER — SIMVASTATIN 20 MG PO TAB
20 mg | ORAL_TABLET | Freq: Every evening | ORAL | 8 refills | Status: CN
Start: 2019-12-07 — End: ?

## 2019-12-07 MED ORDER — AMIODARONE 200 MG PO TAB
200 mg | ORAL_TABLET | Freq: Every day | ORAL | 1 refills | Status: CN
Start: 2019-12-07 — End: ?

## 2019-12-18 ENCOUNTER — Inpatient Hospital Stay: Admit: 2019-12-18 | Discharge: 2019-12-18 | Payer: MEDICAID | Primary: Family

## 2019-12-18 ENCOUNTER — Encounter: Admit: 2019-12-18 | Discharge: 2019-12-18 | Primary: Family

## 2019-12-18 DIAGNOSIS — K220 Achalasia of cardia: Secondary | ICD-10-CM

## 2019-12-18 NOTE — Telephone Encounter
Patient scheduled for planned admission today 3/15. Call to patient placement to get patient a bed. Call to patient to remind him he should be drinking liquids only. No answer. VM left.  Scheduled 3/16 for EGD/POEM - Achalasia type II  Referring: Dr. Leeann Must  [facility] and EUS done at [facility]. Pt seen in clinic

## 2019-12-19 ENCOUNTER — Encounter: Admit: 2019-12-19 | Discharge: 2019-12-19 | Primary: Family

## 2019-12-20 ENCOUNTER — Encounter: Admit: 2019-12-20 | Discharge: 2019-12-20 | Primary: Family

## 2019-12-25 ENCOUNTER — Encounter: Admit: 2019-12-25 | Discharge: 2019-12-25 | Primary: Family

## 2019-12-25 MED ORDER — PEN NEEDLE, DIABETIC 31 GAUGE X 5/16" MISC NDLE
5 refills | Status: CN
Start: 2019-12-25 — End: ?

## 2019-12-25 MED ORDER — PEN NEEDLE, DIABETIC 31 GAUGE X 5/16" MISC NDLE
Freq: Four times a day (QID) | 3 refills | 90.00000 days | Status: AC
Start: 2019-12-25 — End: ?
  Filled 2019-12-26: qty 100, 25d supply, fill #1

## 2019-12-25 MED FILL — INSULIN LISPRO 100 UNIT/ML SC INPN: 100 unit/mL | SUBCUTANEOUS | 25 days supply | Qty: 15 | Fill #2 | Status: AC

## 2019-12-26 ENCOUNTER — Encounter: Admit: 2019-12-26 | Discharge: 2019-12-26 | Primary: Family

## 2019-12-27 ENCOUNTER — Encounter: Admit: 2019-12-27 | Discharge: 2019-12-27 | Primary: Family

## 2019-12-27 NOTE — Telephone Encounter
Elevated Optivol on 12/22/2019. Called patient to follow up on previous call.    Patient states he is feeling good. Yesterday 03/23 he felt dizzy - he thought he was hypoglycemic, was 74 when checked and drank some juice and felt fine after that. He states he checks his home O2 device daily and saw the device gave several O2 numbers when placed on various fingers - ranged from 80% to 100%. He states he was not short of breath at the time, but was worried when he saw the low O2 SATS and decided to place 1 liter of oxygen. He is back on room air at this time, SATS in the upper 90s. He denies chest pain, denies SOB, denies swelling. He called his PCP yesterday with his concern and has an appointment with Dr. Rozetta Nunnery at [facility] tomorrow 03/25.    He does have a scale at home, though has not weighed himself recently. Was not home at this time to check while I was on the phone with him. He says his blood pressures have been steady, though intermittent lightheadedness that only comes with reading - he says he needs to see his eye doctor soon. He denies lightheadedness with movement. Recent BP 95/77 on 03/23.    He is taking his medications as prescribed. He has not taken his metolazone PRN and says he is currently out of this medication. Diet and fluid intake appropriate.    He states he missed his apt on 03/17 with Dr. Lyndel Safe and would like to reschedule this. He also asks if he needs labs checked tomorrow at PCP office.     Will route to Dr. Lyndel Safe for further recs.

## 2019-12-27 NOTE — Telephone Encounter
Spoke with pt:    Patient Status  patient is an established patient with [facility].        Signs and Symptoms  Started yesterday O2 was lower, usually 92% and now to 80-85%  Pt using home O2 started yesterday, has not used in 1.5 years   Dizziness when getting up     No change in swelling   No CP       Medication Review  Patient reports taking all prescribed medications as ordered and not missing any doses        Fluid and Sodium Intake  Patient is drinking under  64 oz of fluid daily     "normal weight is 264" but pt states he has not weighed in some time.     Date Weight B/P Pulse   3/24      3/23  95/77  102/--

## 2019-12-28 ENCOUNTER — Encounter: Admit: 2019-12-28 | Discharge: 2019-12-28 | Primary: Family

## 2019-12-28 MED ORDER — LORATADINE 10 MG PO TAB
10 mg | ORAL_TABLET | Freq: Every day | ORAL | 0 refills | 28.00000 days | Status: DC
Start: 2019-12-28 — End: 2020-03-01
  Filled 2019-12-28: qty 90, 90d supply, fill #1

## 2019-12-29 MED FILL — FARXIGA 10 MG PO TAB: 10 mg | ORAL | 30 days supply | Qty: 90 | Fill #7 | Status: AC

## 2020-01-01 ENCOUNTER — Ambulatory Visit: Admit: 2020-01-01 | Discharge: 2020-01-01 | Primary: Family

## 2020-01-01 ENCOUNTER — Encounter: Admit: 2020-01-01 | Discharge: 2020-01-01 | Primary: Family

## 2020-01-01 DIAGNOSIS — K220 Achalasia of cardia: Secondary | ICD-10-CM

## 2020-01-01 DIAGNOSIS — Z20822 Encounter for screening laboratory testing for COVID-19 virus in asymptomatic patient: Secondary | ICD-10-CM

## 2020-01-03 ENCOUNTER — Encounter: Admit: 2020-01-03 | Discharge: 2020-01-03 | Primary: Family

## 2020-01-03 MED ORDER — SIMVASTATIN 20 MG PO TAB
20 mg | ORAL_TABLET | Freq: Every evening | ORAL | 8 refills | Status: CN
Start: 2020-01-03 — End: ?

## 2020-01-03 MED ORDER — PEN NEEDLE, DIABETIC 31 GAUGE X 5/16" MISC NDLE
5 refills | Status: CN
Start: 2020-01-03 — End: ?

## 2020-01-03 MED ORDER — AMIODARONE 200 MG PO TAB
200 mg | ORAL_TABLET | Freq: Every day | ORAL | 1 refills
Start: 2020-01-03 — End: ?

## 2020-01-03 MED ORDER — AMIODARONE 200 MG PO TAB
200 mg | ORAL_TABLET | Freq: Every day | ORAL | 1 refills | Status: CN
Start: 2020-01-03 — End: ?

## 2020-01-09 ENCOUNTER — Encounter: Admit: 2020-01-09 | Discharge: 2020-01-09 | Primary: Family

## 2020-01-09 MED ORDER — SIMVASTATIN 20 MG PO TAB
20 mg | ORAL_TABLET | Freq: Every evening | ORAL | 1 refills | Status: AC
Start: 2020-01-09 — End: ?
  Filled 2020-04-05: qty 90, 90d supply, fill #1

## 2020-01-10 ENCOUNTER — Encounter: Admit: 2020-01-10 | Discharge: 2020-01-10 | Primary: Family

## 2020-01-10 DIAGNOSIS — I5022 Chronic systolic (congestive) heart failure: Secondary | ICD-10-CM

## 2020-01-10 DIAGNOSIS — Z95810 Presence of automatic (implantable) cardiac defibrillator: Secondary | ICD-10-CM

## 2020-01-10 MED ORDER — DOXYCYCLINE HYCLATE 100 MG PO TAB
100 mg | ORAL_TABLET | Freq: Two times a day (BID) | ORAL | 0 refills | 8.00000 days | Status: AC
Start: 2020-01-10 — End: ?
  Filled 2020-01-10: qty 20, 10d supply, fill #1

## 2020-01-11 ENCOUNTER — Encounter: Admit: 2020-01-11 | Discharge: 2020-01-11 | Primary: Family

## 2020-01-11 MED ORDER — AMIODARONE 200 MG PO TAB
200 mg | ORAL_TABLET | Freq: Every day | ORAL | 1 refills
Start: 2020-01-11 — End: ?

## 2020-01-11 MED FILL — BUPROPION SR 200 MG PO TBSR: 200 mg | ORAL | 30 days supply | Qty: 60 | Fill #2 | Status: AC

## 2020-01-12 ENCOUNTER — Encounter: Admit: 2020-01-12 | Discharge: 2020-01-12 | Primary: Family

## 2020-01-12 ENCOUNTER — Observation Stay: Admit: 2020-01-12 | Discharge: 2020-01-12 | Payer: MEDICAID | Primary: Family

## 2020-01-12 DIAGNOSIS — K220 Achalasia of cardia: Secondary | ICD-10-CM

## 2020-01-12 MED ORDER — AMIODARONE 200 MG PO TAB
200 mg | ORAL_TABLET | Freq: Every day | ORAL | 1 refills | Status: CN
Start: 2020-01-12 — End: ?

## 2020-01-15 ENCOUNTER — Encounter: Admit: 2020-01-15 | Discharge: 2020-01-15 | Primary: Family

## 2020-01-15 MED FILL — ERGOCALCIFEROL (VITAMIN D2) 1,250 MCG (50,000 UNIT) PO CAP: 1250 mcg (50,000 unit) | ORAL | 28 days supply | Qty: 13 | Fill #2 | Status: AC

## 2020-01-15 NOTE — Telephone Encounter
Patient scheduled for planned admission today 4/12. Patient placement to call patient when a bed is available. Call to patient to remind him he should be drinking liquids only and should be holding Eliquis and ASA. No answer. VM left.  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 Leeann Must  [facility] and EUS done at [facility]. Pt seen in clinic

## 2020-01-16 ENCOUNTER — Encounter: Admit: 2020-01-16 | Discharge: 2020-01-16 | Primary: Family

## 2020-01-18 ENCOUNTER — Encounter: Admit: 2020-01-18 | Discharge: 2020-01-18 | Primary: Family

## 2020-01-18 MED ORDER — POTASSIUM CHLORIDE 10 MEQ PO TBER
40 meq | ORAL_TABLET | Freq: Two times a day (BID) | ORAL | 0 refills | 30.00000 days | Status: AC
Start: 2020-01-18 — End: ?
  Filled 2020-01-19: qty 300, 38d supply, fill #1

## 2020-01-18 MED ORDER — INSULIN LISPRO 100 UNIT/ML SC INPN
20 [IU] | Freq: Three times a day (TID) | SUBCUTANEOUS | 3 refills | 30.00000 days | Status: AC
Start: 2020-01-18 — End: ?
  Filled 2020-01-19: qty 60, 25d supply, fill #1

## 2020-01-18 MED ORDER — TOUJEO SOLOSTAR U-300 INSULIN 300 UNIT/ML (1.5 ML) SC INPN
60 [IU] | Freq: Every day | SUBCUTANEOUS | 3 refills | 60.00000 days | Status: AC
Start: 2020-01-18 — End: ?

## 2020-01-18 MED FILL — TORSEMIDE 100 MG PO TAB: 100 mg | ORAL | 30 days supply | Qty: 180 | Fill #2 | Status: AC

## 2020-01-18 MED FILL — PREGABALIN 200 MG PO CAP: 200 mg | ORAL | 30 days supply | Qty: 90 | Fill #2 | Status: AC

## 2020-01-19 ENCOUNTER — Encounter: Admit: 2020-01-19 | Discharge: 2020-01-19 | Primary: Family

## 2020-01-19 MED ORDER — PEN NEEDLE, DIABETIC 32 GAUGE X 5/32" MISC NDLE
1 | Freq: Four times a day (QID) | 3 refills | 30.00000 days | Status: AC
Start: 2020-01-19 — End: ?
  Filled 2020-01-19: qty 400, 25d supply, fill #1

## 2020-01-21 ENCOUNTER — Encounter: Admit: 2020-01-21 | Discharge: 2020-01-21 | Primary: Family

## 2020-01-21 MED FILL — ELIQUIS 5 MG PO TAB: 5 mg | ORAL | 30 days supply | Qty: 300 | Fill #2 | Status: AC

## 2020-01-22 MED ORDER — TOUJEO SOLOSTAR U-300 INSULIN 300 UNIT/ML (1.5 ML) SC INPN
60 [IU] | Freq: Every day | SUBCUTANEOUS | 3 refills | 68.00000 days | Status: AC
Start: 2020-01-22 — End: ?
  Filled 2020-01-23: qty 4.5, 22d supply, fill #1

## 2020-01-23 MED ORDER — CARVEDILOL 6.25 MG PO TAB
6.25 mg | ORAL_TABLET | Freq: Two times a day (BID) | ORAL | 3 refills | 90.00000 days | Status: AC
Start: 2020-01-23 — End: ?
  Filled 2020-01-23: qty 180, 90d supply, fill #1

## 2020-01-23 MED FILL — PEN NEEDLE, DIABETIC 31 GAUGE X 5/16" MISC NDLE: 31 gauge x 5/16" | 25 days supply | Qty: 100 | Fill #2 | Status: AC

## 2020-01-24 MED ORDER — AMIODARONE 200 MG PO TAB
200 mg | ORAL_TABLET | Freq: Every day | ORAL | 3 refills | 42.00000 days | Status: AC
Start: 2020-01-24 — End: ?
  Filled 2020-01-25: qty 30, 30d supply, fill #1

## 2020-02-07 ENCOUNTER — Encounter: Admit: 2020-02-07 | Discharge: 2020-02-07 | Primary: Family

## 2020-02-07 MED ORDER — BENZONATATE 100 MG PO CAP
100 mg | ORAL_CAPSULE | Freq: Three times a day (TID) | ORAL | 0 refills | 9.00000 days | Status: AC
Start: 2020-02-07 — End: ?
  Filled 2020-02-07: qty 12, 4d supply, fill #1

## 2020-02-07 MED ORDER — AZITHROMYCIN 250 MG PO TAB
ORAL_TABLET | 0 refills | Status: AC
Start: 2020-02-07 — End: ?
  Filled 2020-02-07: qty 6, 6d supply, fill #1

## 2020-02-07 MED ORDER — ALBUTEROL SULFATE 90 MCG/ACTUATION IN HFAA
2 | RESPIRATORY_TRACT | 0 refills | Status: DC | PRN
Start: 2020-02-07 — End: 2020-02-21
  Filled 2020-02-07: qty 6.7, 30d supply, fill #1

## 2020-02-07 MED FILL — FARXIGA 10 MG PO TAB: 10 mg | ORAL | 30 days supply | Qty: 30 | Fill #8 | Status: AC

## 2020-02-10 ENCOUNTER — Encounter: Admit: 2020-02-10 | Discharge: 2020-02-10 | Primary: Family

## 2020-02-11 ENCOUNTER — Encounter: Admit: 2020-02-11 | Discharge: 2020-02-11 | Primary: Family

## 2020-02-11 MED FILL — ERGOCALCIFEROL (VITAMIN D2) 1,250 MCG (50,000 UNIT) PO CAP: 1250 mcg (50,000 unit) | ORAL | 28 days supply | Qty: 13 | Fill #3 | Status: AC

## 2020-02-12 ENCOUNTER — Encounter: Admit: 2020-02-12 | Discharge: 2020-02-12 | Primary: Family

## 2020-02-12 MED FILL — BUPROPION SR 200 MG PO TBSR: 200 mg | ORAL | 30 days supply | Qty: 60 | Fill #3 | Status: AC

## 2020-02-15 ENCOUNTER — Encounter: Admit: 2020-02-15 | Discharge: 2020-02-15 | Primary: Family

## 2020-02-15 MED FILL — TOUJEO SOLOSTAR U-300 INSULIN 300 UNIT/ML (1.5 ML) SC INPN: 300 unit/mL (1.5 mL) | SUBCUTANEOUS | 22 days supply | Qty: 60 | Fill #2 | Status: AC

## 2020-02-15 MED FILL — INSULIN LISPRO 100 UNIT/ML SC INPN: 100 unit/mL | SUBCUTANEOUS | 25 days supply | Qty: 60 | Fill #2 | Status: AC

## 2020-02-16 ENCOUNTER — Encounter: Admit: 2020-02-16 | Discharge: 2020-02-16 | Primary: Family

## 2020-02-16 MED FILL — ELIQUIS 5 MG PO TAB: 5 mg | ORAL | 30 days supply | Qty: 60 | Fill #3 | Status: AC

## 2020-02-21 MED ORDER — ALBUTEROL SULFATE 90 MCG/ACTUATION IN HFAA
2 | RESPIRATORY_TRACT | 0 refills | Status: CN | PRN
Start: 2020-02-21 — End: ?

## 2020-03-01 ENCOUNTER — Encounter: Admit: 2020-03-01 | Discharge: 2020-03-01 | Primary: Family

## 2020-03-01 DIAGNOSIS — J309 Allergic rhinitis, unspecified: Secondary | ICD-10-CM

## 2020-03-01 MED ORDER — LORATADINE 10 MG PO TAB
10 mg | ORAL_TABLET | ORAL | 3 refills | 28.00000 days | Status: AC
Start: 2020-03-01 — End: ?
  Filled 2020-04-25: qty 90, 90d supply, fill #1

## 2020-03-01 MED ORDER — LORATADINE 10 MG PO TAB
10 mg | ORAL_TABLET | Freq: Every day | ORAL | 0 refills
Start: 2020-03-01 — End: ?

## 2020-03-01 MED FILL — PEN NEEDLE, DIABETIC 31 GAUGE X 5/16" MISC NDLE: 31 gauge x 5/16" | 25 days supply | Qty: 100 | Fill #3 | Status: AC

## 2020-03-01 MED FILL — AMIODARONE 200 MG PO TAB: 200 mg | ORAL | 30 days supply | Qty: 90 | Fill #2 | Status: AC

## 2020-03-01 MED FILL — ELIQUIS 5 MG PO TAB: 5 mg | ORAL | 30 days supply | Qty: 300 | Fill #4 | Status: AC

## 2020-03-01 MED FILL — PREGABALIN 200 MG PO CAP: 200 mg | ORAL | 30 days supply | Qty: 90 | Fill #3 | Status: AC

## 2020-03-02 ENCOUNTER — Encounter: Admit: 2020-03-02 | Discharge: 2020-03-02 | Primary: Family

## 2020-03-03 ENCOUNTER — Encounter: Admit: 2020-03-03 | Discharge: 2020-03-03 | Primary: Family

## 2020-03-03 MED FILL — FARXIGA 10 MG PO TAB: 10 mg | ORAL | 30 days supply | Qty: 90 | Fill #9 | Status: AC

## 2020-03-03 MED FILL — ERGOCALCIFEROL (VITAMIN D2) 1,250 MCG (50,000 UNIT) PO CAP: 1250 mcg (50,000 unit) | ORAL | 28 days supply | Qty: 4 | Fill #4 | Status: AC

## 2020-03-04 ENCOUNTER — Encounter: Admit: 2020-03-04 | Discharge: 2020-03-04 | Primary: Family

## 2020-03-08 ENCOUNTER — Encounter: Admit: 2020-03-08 | Discharge: 2020-03-08 | Primary: Family

## 2020-03-08 MED FILL — BUPROPION SR 200 MG PO TBSR: 200 mg | ORAL | 30 days supply | Qty: 60 | Fill #4 | Status: AC

## 2020-03-08 MED FILL — INSULIN LISPRO 100 UNIT/ML SC INPN: 100 unit/mL | SUBCUTANEOUS | 25 days supply | Qty: 60 | Fill #3 | Status: AC

## 2020-03-08 MED FILL — TOUJEO SOLOSTAR U-300 INSULIN 300 UNIT/ML (1.5 ML) SC INPN: 300 unit/mL (1.5 mL) | SUBCUTANEOUS | 22 days supply | Qty: 60 | Fill #1 | Status: AC

## 2020-03-25 ENCOUNTER — Encounter: Admit: 2020-03-25 | Discharge: 2020-03-25 | Primary: Family

## 2020-03-25 NOTE — Telephone Encounter
-----   Message from [place] sent at 03/24/2020  6:50 PM CDT -----  Regarding: Pt of Dr Chales Abrahams  # Heart failure diagnostics assessed through the device  # Status: Elevated - at an index of >200 suggestive of significant fluid accumulation at this time     Please follow up as needed

## 2020-03-25 NOTE — Telephone Encounter
See update from Cardio MEMs team below re: status of MEMs transmissions. Awaiting f/u from pt for symptoms r/t device alert and rescheduling HF APP appt.     Minor, Thayer Ohm, RN  Guy Franco; P Cvm Nurse Hf Team [place]   Caller: Unspecified (Today, ?2:46 PM)   We have attempted to schedule a re-calibration for him multiple times. He has cancelled the procedure each time or not shown up for that and/or the appointments prior. His CardioMEMS readings, when sent, are not accurate.

## 2020-03-25 NOTE — Telephone Encounter
Called and LMOM of pt to f/u device alert for poss fluid accumulation. Requested call back to HF nurse line with update on SOA, swelling, bloating, weight gain, increased fatigue, etc. Note pt has had Cardio [facility] documented as placed at outside hospital and do not note transmissions to [place] team. Pt missed 6 month f/u on 6/14 with NM, NP which had been  requested by [facility] at 08/11/2019 appt with f/u labs.     Pt does not have MY CHart set up, will attempt call back later and also reschedule HF f/u appt with APP and labs. Also routed to [facility] team for update on MEMs transmissions.

## 2020-03-26 ENCOUNTER — Encounter: Admit: 2020-03-26 | Discharge: 2020-03-26 | Primary: Family

## 2020-03-26 MED FILL — AMIODARONE 200 MG PO TAB: 200 mg | ORAL | 30 days supply | Qty: 30 | Fill #3 | Status: AC

## 2020-04-02 ENCOUNTER — Encounter: Admit: 2020-04-02 | Discharge: 2020-04-02 | Primary: Family

## 2020-04-02 MED ORDER — FARXIGA 10 MG PO TAB
10 mg | ORAL_TABLET | Freq: Every morning | ORAL | 3 refills | Status: CN
Start: 2020-04-02 — End: ?

## 2020-04-03 ENCOUNTER — Encounter: Admit: 2020-04-03 | Discharge: 2020-04-03 | Primary: Family

## 2020-04-03 MED FILL — ERGOCALCIFEROL (VITAMIN D2) 1,250 MCG (50,000 UNIT) PO CAP: 1250 mcg (50,000 unit) | ORAL | 28 days supply | Qty: 4 | Fill #5 | Status: AC

## 2020-04-05 ENCOUNTER — Encounter: Admit: 2020-04-05 | Discharge: 2020-04-05 | Primary: Family

## 2020-04-05 MED ORDER — OMEPRAZOLE 40 MG PO CPDR
40 mg | ORAL_CAPSULE | Freq: Every day | ORAL | 0 refills | Status: CN
Start: 2020-04-05 — End: ?

## 2020-04-05 MED ORDER — OMEPRAZOLE 40 MG PO CPDR
40 mg | ORAL_CAPSULE | Freq: Every day | ORAL | 10 refills | Status: AC
Start: 2020-04-05 — End: ?
  Filled 2020-04-05: qty 90, 90d supply, fill #1

## 2020-04-05 MED FILL — PEN NEEDLE, DIABETIC 31 GAUGE X 5/16" MISC NDLE: 31 gauge x 5/16" | 25 days supply | Qty: 400 | Fill #4 | Status: AC

## 2020-04-05 MED FILL — SPIRONOLACTONE 25 MG PO TAB: 25 mg | ORAL | 90 days supply | Qty: 90 | Fill #4 | Status: AC

## 2020-04-05 MED FILL — ELIQUIS 5 MG PO TAB: 5 mg | ORAL | 30 days supply | Qty: 60 | Fill #5 | Status: AC

## 2020-04-07 ENCOUNTER — Encounter: Admit: 2020-04-07 | Discharge: 2020-04-07 | Primary: Family

## 2020-04-07 MED FILL — BUPROPION SR 200 MG PO TBSR: 200 mg | ORAL | 30 days supply | Qty: 60 | Fill #5 | Status: CP

## 2020-04-17 ENCOUNTER — Encounter: Admit: 2020-04-17 | Discharge: 2020-04-17 | Primary: Family

## 2020-04-17 MED FILL — CARVEDILOL 6.25 MG PO TAB: 6.25 mg | ORAL | 90 days supply | Qty: 180 | Fill #2 | Status: AC

## 2020-04-25 ENCOUNTER — Encounter: Admit: 2020-04-25 | Discharge: 2020-04-25 | Primary: Family

## 2020-04-25 MED ORDER — ELIQUIS 5 MG PO TAB
5 mg | ORAL_TABLET | Freq: Two times a day (BID) | ORAL | 0 refills | Status: AC
Start: 2020-04-25 — End: ?
  Filled 2020-04-29: qty 60, 30d supply, fill #1

## 2020-04-25 MED ORDER — APIXABAN 5 MG PO TAB
5 mg | ORAL_TABLET | Freq: Two times a day (BID) | ORAL | 0 refills | Status: CN
Start: 2020-04-25 — End: ?

## 2020-04-25 MED ORDER — OMEPRAZOLE 40 MG PO CPDR
40 mg | ORAL_CAPSULE | Freq: Every day | ORAL | 0 refills | Status: CN
Start: 2020-04-25 — End: ?

## 2020-04-25 MED ORDER — FARXIGA 10 MG PO TAB
10 mg | ORAL_TABLET | Freq: Every morning | ORAL | 3 refills | Status: CN
Start: 2020-04-25 — End: ?

## 2020-04-25 MED FILL — AMIODARONE 200 MG PO TAB: 200 mg | ORAL | 30 days supply | Qty: 30 | Fill #4 | Status: AC

## 2020-04-25 MED FILL — ERGOCALCIFEROL (VITAMIN D2) 1,250 MCG (50,000 UNIT) PO CAP: 1250 mcg (50,000 unit) | ORAL | 28 days supply | Qty: 13 | Fill #6 | Status: AC

## 2020-04-25 MED FILL — TOUJEO SOLOSTAR U-300 INSULIN 300 UNIT/ML (1.5 ML) SC INPN: 300 unit/mL (1.5 mL) | SUBCUTANEOUS | 22 days supply | Qty: 60 | Fill #2 | Status: AC

## 2020-04-25 MED FILL — PEN NEEDLE, DIABETIC 31 GAUGE X 5/16" MISC NDLE: 31 gauge x 5/16" | 25 days supply | Qty: 100 | Fill #5 | Status: AC

## 2020-04-25 MED FILL — INSULIN LISPRO 100 UNIT/ML SC INPN: 100 unit/mL | SUBCUTANEOUS | 25 days supply | Qty: 60 | Fill #4 | Status: AC

## 2020-04-29 ENCOUNTER — Encounter: Admit: 2020-04-29 | Discharge: 2020-04-29 | Primary: Family

## 2020-04-30 ENCOUNTER — Encounter: Admit: 2020-04-30 | Discharge: 2020-04-30 | Primary: Family

## 2020-04-30 MED ORDER — FARXIGA 10 MG PO TAB
10 mg | ORAL_TABLET | Freq: Every morning | ORAL | 3 refills
Start: 2020-04-30 — End: ?

## 2020-05-05 DEATH — deceased

## 2020-05-07 ENCOUNTER — Encounter: Admit: 2020-05-07 | Discharge: 2020-05-07 | Primary: Family

## 2020-05-07 MED ORDER — BUPROPION SR 200 MG PO TBSR
200 mg | ORAL_TABLET | Freq: Two times a day (BID) | ORAL | 4 refills
Start: 2020-05-07 — End: ?

## 2020-05-09 ENCOUNTER — Encounter: Admit: 2020-05-09 | Discharge: 2020-05-09 | Primary: Family

## 2020-05-09 NOTE — Progress Notes
Patient no showed to appointment with Larry Pena on 05/09/20 at 3:00pm

## 2020-05-15 ENCOUNTER — Encounter: Admit: 2020-05-15 | Discharge: 2020-05-15 | Primary: Family

## 2020-05-15 NOTE — Telephone Encounter
Received notification that  [employer] has not been connected since 04/26/20. Patient was instructed to look at his/her transmitter to make sure that it is plugged into power and send a manual transmission to reconnect the transmitter. If he/she has any questions about how to send a transmission or if the transmitter does not appear to be working properly, they need to contact the device company directly. Patient was provided with that contact number. Requested the patient send [place] a MyChart message or contact our device nurses at [phone] to let us know after they have sent their transmission. Patients preferred phone number [phone]) is a non-working number. Patients authorized party to disclose health information, Emilee Hero [phone]) is a non-working number. We have no other contact numbers or authorized parties on file. No active MyChart available. Mailed letter. DB.

## 2020-06-01 ENCOUNTER — Encounter: Admit: 2020-06-01 | Discharge: 2020-06-01 | Primary: Family

## 2020-12-03 ENCOUNTER — Encounter: Admit: 2020-12-03 | Discharge: 2020-12-03 | Primary: Family

## 2021-03-26 ENCOUNTER — Encounter: Admit: 2021-03-26 | Discharge: 2021-03-26 | Primary: Family

## 2024-08-04 ENCOUNTER — Encounter: Admit: 2024-08-04 | Discharge: 2024-08-04 | Primary: Family
